# Patient Record
Sex: Female | Born: 1975 | Hispanic: Yes | Smoking: Never smoker
Health system: Southern US, Community
[De-identification: ages and names within clinical notes are randomized; demographics above are authoritative.]

## PROBLEM LIST (undated history)

## (undated) DIAGNOSIS — Z87442 Personal history of urinary calculi: Secondary | ICD-10-CM

## (undated) DIAGNOSIS — R110 Nausea: Secondary | ICD-10-CM

## (undated) DIAGNOSIS — Z889 Allergy status to unspecified drugs, medicaments and biological substances status: Secondary | ICD-10-CM

## (undated) DIAGNOSIS — F419 Anxiety disorder, unspecified: Secondary | ICD-10-CM

## (undated) DIAGNOSIS — M1990 Unspecified osteoarthritis, unspecified site: Secondary | ICD-10-CM

## (undated) DIAGNOSIS — F32A Depression, unspecified: Secondary | ICD-10-CM

## (undated) HISTORY — PX: APPENDECTOMY: SHX54

## (undated) HISTORY — PX: GANGLION CYST EXCISION: SHX1691

## (undated) HISTORY — PX: SEPTOPLASTY: SUR1290

## (undated) HISTORY — PX: WISDOM TOOTH EXTRACTION: SHX21

---

## 2001-05-26 ENCOUNTER — Encounter: Payer: Self-pay | Admitting: Internal Medicine

## 2001-05-26 ENCOUNTER — Ambulatory Visit (HOSPITAL_COMMUNITY): Admission: RE | Admit: 2001-05-26 | Discharge: 2001-05-26 | Payer: Self-pay | Admitting: Internal Medicine

## 2002-11-01 ENCOUNTER — Other Ambulatory Visit: Admission: RE | Admit: 2002-11-01 | Discharge: 2002-11-01 | Payer: Self-pay | Admitting: Obstetrics and Gynecology

## 2003-11-20 ENCOUNTER — Other Ambulatory Visit: Admission: RE | Admit: 2003-11-20 | Discharge: 2003-11-20 | Payer: Self-pay | Admitting: Obstetrics and Gynecology

## 2004-11-07 ENCOUNTER — Other Ambulatory Visit: Admission: RE | Admit: 2004-11-07 | Discharge: 2004-11-07 | Payer: Self-pay | Admitting: Obstetrics and Gynecology

## 2007-10-23 ENCOUNTER — Emergency Department (HOSPITAL_COMMUNITY): Admission: EM | Admit: 2007-10-23 | Discharge: 2007-10-23 | Payer: Self-pay | Admitting: Emergency Medicine

## 2008-01-31 ENCOUNTER — Encounter: Admission: RE | Admit: 2008-01-31 | Discharge: 2008-01-31 | Payer: Self-pay | Admitting: Internal Medicine

## 2010-01-28 ENCOUNTER — Encounter: Admission: RE | Admit: 2010-01-28 | Discharge: 2010-01-28 | Payer: Self-pay | Admitting: Internal Medicine

## 2010-06-20 ENCOUNTER — Encounter
Admission: RE | Admit: 2010-06-20 | Discharge: 2010-06-20 | Payer: Self-pay | Source: Home / Self Care | Attending: Gastroenterology | Admitting: Gastroenterology

## 2010-07-29 ENCOUNTER — Other Ambulatory Visit (HOSPITAL_COMMUNITY): Payer: Self-pay | Admitting: Gastroenterology

## 2010-07-29 DIAGNOSIS — R1013 Epigastric pain: Secondary | ICD-10-CM

## 2010-08-07 ENCOUNTER — Ambulatory Visit (HOSPITAL_COMMUNITY)
Admission: RE | Admit: 2010-08-07 | Discharge: 2010-08-07 | Disposition: A | Discharge status: Home / Self Care | Payer: BC Managed Care – PPO | Source: Ambulatory Visit | Attending: Gastroenterology | Admitting: Gastroenterology

## 2010-08-07 DIAGNOSIS — R1013 Epigastric pain: Secondary | ICD-10-CM | POA: Insufficient documentation

## 2010-08-07 MED ORDER — TECHNETIUM TC 99M MEBROFENIN IV KIT
5.0000 | PACK | Freq: Once | INTRAVENOUS | Status: AC | PRN
  Administered 2010-08-07: 5 via INTRAVENOUS

## 2010-08-19 ENCOUNTER — Other Ambulatory Visit (HOSPITAL_COMMUNITY): Payer: Self-pay | Admitting: Gastroenterology

## 2010-08-19 DIAGNOSIS — R1013 Epigastric pain: Secondary | ICD-10-CM

## 2010-08-19 DIAGNOSIS — R110 Nausea: Secondary | ICD-10-CM

## 2010-08-22 ENCOUNTER — Encounter (HOSPITAL_COMMUNITY)
Admission: RE | Admit: 2010-08-22 | Discharge: 2010-08-22 | Disposition: A | Discharge status: Home / Self Care | Payer: BC Managed Care – PPO | Source: Ambulatory Visit | Attending: Gastroenterology | Admitting: Gastroenterology

## 2010-08-22 ENCOUNTER — Encounter (HOSPITAL_COMMUNITY): Payer: Self-pay

## 2010-08-22 DIAGNOSIS — R110 Nausea: Secondary | ICD-10-CM

## 2010-08-22 DIAGNOSIS — E119 Type 2 diabetes mellitus without complications: Secondary | ICD-10-CM | POA: Insufficient documentation

## 2010-08-22 DIAGNOSIS — R1013 Epigastric pain: Secondary | ICD-10-CM | POA: Insufficient documentation

## 2010-08-22 HISTORY — DX: Nausea: R11.0

## 2010-08-22 MED ORDER — TECHNETIUM TC 99M SULFUR COLLOID
2.0000 | Freq: Once | INTRAVENOUS | Status: AC | PRN
  Administered 2010-08-22: 2 via ORAL

## 2011-03-05 LAB — PROTIME-INR
INR: 1.1
Prothrombin Time: 14.5

## 2011-03-05 LAB — POCT I-STAT, CHEM 8
BUN: 19
Calcium, Ion: 1.15
Chloride: 107
Creatinine, Ser: 0.9
Glucose, Bld: 80
HCT: 43
Hemoglobin: 14.6
Potassium: 3.8
Sodium: 139
TCO2: 22

## 2011-03-05 LAB — OCCULT BLOOD X 1 CARD TO LAB, STOOL: Fecal Occult Bld: POSITIVE

## 2011-09-07 ENCOUNTER — Emergency Department (HOSPITAL_COMMUNITY): Payer: BC Managed Care – PPO

## 2011-09-07 ENCOUNTER — Encounter (HOSPITAL_COMMUNITY): Payer: Self-pay | Admitting: *Deleted

## 2011-09-07 ENCOUNTER — Emergency Department (HOSPITAL_COMMUNITY)
Admission: EM | Admit: 2011-09-07 | Discharge: 2011-09-07 | Disposition: A | Discharge status: Home / Self Care | Payer: BC Managed Care – PPO | Attending: Emergency Medicine | Admitting: Emergency Medicine

## 2011-09-07 DIAGNOSIS — M549 Dorsalgia, unspecified: Secondary | ICD-10-CM | POA: Insufficient documentation

## 2011-09-07 DIAGNOSIS — N200 Calculus of kidney: Secondary | ICD-10-CM

## 2011-09-07 DIAGNOSIS — R300 Dysuria: Secondary | ICD-10-CM | POA: Insufficient documentation

## 2011-09-07 HISTORY — DX: Allergy status to unspecified drugs, medicaments and biological substances: Z88.9

## 2011-09-07 LAB — URINALYSIS, ROUTINE W REFLEX MICROSCOPIC
Nitrite: NEGATIVE
Protein, ur: NEGATIVE mg/dL
Specific Gravity, Urine: 1.027 (ref 1.005–1.030)
Urobilinogen, UA: 0.2 mg/dL (ref 0.0–1.0)

## 2011-09-07 LAB — POCT I-STAT, CHEM 8
Creatinine, Ser: 1 mg/dL (ref 0.50–1.10)
HCT: 38 % (ref 36.0–46.0)
Hemoglobin: 12.9 g/dL (ref 12.0–15.0)
Potassium: 4 mEq/L (ref 3.5–5.1)
Sodium: 141 mEq/L (ref 135–145)
TCO2: 20 mmol/L (ref 0–100)

## 2011-09-07 MED ORDER — HYDROCODONE-ACETAMINOPHEN 5-325 MG PO TABS
1.0000 | ORAL_TABLET | Freq: Once | ORAL | Status: AC
  Administered 2011-09-07: 1 via ORAL
  Filled 2011-09-07: qty 1

## 2011-09-07 MED ORDER — MORPHINE SULFATE 4 MG/ML IJ SOLN
4.0000 mg | Freq: Once | INTRAMUSCULAR | Status: AC
  Administered 2011-09-07: 4 mg via INTRAVENOUS
  Filled 2011-09-07: qty 1

## 2011-09-07 MED ORDER — KETOROLAC TROMETHAMINE 30 MG/ML IJ SOLN
30.0000 mg | Freq: Once | INTRAMUSCULAR | Status: AC
  Administered 2011-09-07: 30 mg via INTRAVENOUS
  Filled 2011-09-07: qty 1

## 2011-09-07 MED ORDER — SODIUM CHLORIDE 0.9 % IV BOLUS (SEPSIS)
1000.0000 mL | Freq: Once | INTRAVENOUS | Status: AC
  Administered 2011-09-07: 1000 mL via INTRAVENOUS

## 2011-09-07 MED ORDER — ONDANSETRON HCL 4 MG/2ML IJ SOLN
4.0000 mg | Freq: Once | INTRAMUSCULAR | Status: AC
  Administered 2011-09-07: 4 mg via INTRAVENOUS
  Filled 2011-09-07: qty 2

## 2011-09-07 MED ORDER — HYDROCODONE-ACETAMINOPHEN 5-325 MG PO TABS: 2.0000 | ORAL_TABLET | ORAL | Status: AC | PRN

## 2011-09-07 NOTE — Discharge Instructions (Signed)
You were seen and evaluated for your complaints of flank and back pains. Your CAT scan today shows signs of a possible kidney stone but at this time it appears the stone may have passed. Please followup with the urology specialist or your primary care provider for continued evaluation and treatment if you have continue to persist and pains. If you develop any fever, chills, sweats, persistent nausea vomiting return to the emergency room.   Kidney Stones Kidney stones (ureteral lithiasis) are deposits that form inside your kidneys. The intense pain is caused by the stone moving through the urinary tract. When the stone moves, the ureter goes into spasm around the stone. The stone is usually passed in the urine.  CAUSES   A disorder that makes certain neck glands produce too much parathyroid hormone (primary hyperparathyroidism).   A buildup of uric acid crystals.   Narrowing (stricture) of the ureter.   A kidney obstruction present at birth (congenital obstruction).   Previous surgery on the kidney or ureters.   Numerous kidney infections.  SYMPTOMS   Feeling sick to your stomach (nauseous).   Throwing up (vomiting).   Blood in the urine (hematuria).   Pain that usually spreads (radiates) to the groin.   Frequency or urgency of urination.  DIAGNOSIS   Taking a history and physical exam.   Blood or urine tests.   Computerized X-ray scan (CT scan).   Occasionally, an examination of the inside of the urinary bladder (cystoscopy) is performed.  TREATMENT   Observation.   Increasing your fluid intake.   Surgery may be needed if you have severe pain or persistent obstruction.  The size, location, and chemical composition are all important variables that will determine the proper choice of action for you. Talk to your caregiver to better understand your situation so that you will minimize the risk of injury to yourself and your kidney.  HOME CARE INSTRUCTIONS   Drink enough  water and fluids to keep your urine clear or pale yellow.   Strain all urine through the provided strainer. Keep all particulate matter and stones for your caregiver to see. The stone causing the pain may be as small as a grain of salt. It is very important to use the strainer each and every time you pass your urine. The collection of your stone will allow your caregiver to analyze it and verify that a stone has actually passed.   Only take over-the-counter or prescription medicines for pain, discomfort, or fever as directed by your caregiver.   Make a follow-up appointment with your caregiver as directed.   Get follow-up X-rays if required. The absence of pain does not always mean that the stone has passed. It may have only stopped moving. If the urine remains completely obstructed, it can cause loss of kidney function or even complete destruction of the kidney. It is your responsibility to make sure X-rays and follow-ups are completed. Ultrasounds of the kidney can show blockages and the status of the kidney. Ultrasounds are not associated with any radiation and can be performed easily in a matter of minutes.  SEEK IMMEDIATE MEDICAL CARE IF:   Pain cannot be controlled with the prescribed medicine.   You have a fever.   The severity or intensity of pain increases over 18 hours and is not relieved by pain medicine.   You develop a new onset of abdominal pain.   You feel faint or pass out.  MAKE SURE YOU:   Understand these instructions.  Will watch your condition.   Will get help right away if you are not doing well or get worse.  Document Released: 05/26/2005 Document Revised: 05/15/2011 Document Reviewed: 09/21/2009 [facility] Patient Information 2012 [place], [place].   RESOURCE GUIDE  Dental Problems  Patients with Medicaid: [facility] [phone] [address]                                           [phone] [address] Phone:   [phone]                                                  Phone:  [phone]  If unable to pay or uninsured, contact:  [facility] or [facility]. to become qualified for the adult dental clinic.  Chronic Pain Problems Contact [facility]  [phone] Patients need to be referred by their primary care doctor.  Insufficient Money for Medicine Contact [employer] Way:  call "211" or [facility] 201-062-8767.  No Primary Care Doctor Call [facility]  [phone] Other agencies that provide inexpensive medical care    [facility]  [phone]    [facility]  [phone]    [facility]  [phone]    [facility]  [phone]    [facility]  [phone]    [facility]  8182320498  Psychological Services [facility]  [phone] [facility]  [phone] [facility]   [phone] (emergency services 7697171739)  Substance Abuse Resources [facility]  623-167-5793 [facility] [phone] [facility] [phone] Floydene Flock ([phone] Residential & Outpatient Substance Abuse Program  9206935932  Abuse/Neglect [facility] Child Abuse Hotline (831)116-3558 [facility] Child Abuse Hotline (210)188-7220 (After Hours)  Emergency Shelter [facility] [phone]  [facility] [phone] [facility] [phone]  MRSA Hotline #:   872 618 2900    [facility] Resources  [facility]     [employer]                          [facility] Dept. [address] [place]                       [address]      [address]  [place]                                                Cristobal Goldmann Phone:  [phone]  Phone:  [phone]                  Phone:  [phone]  [facility] Phone:  [phone]  [facility] Child Abuse Hotline 620-376-3474 [phone] (After Hours)

## 2011-09-07 NOTE — ED Notes (Signed)
Pt c/o R low back pain radiating to R pelvis starting tonight at 2145, nausea, denies vomiting. Pt also c/o dysuria.

## 2011-09-07 NOTE — ED Provider Notes (Signed)
History     CSN: [id number]  Arrival date & time 09/07/11  0008   First MD Initiated Contact with Patient 09/07/11 0158      Chief Complaint  Patient presents with  . Back Pain    radiating to pelvis  . Dysuria    HPI  History provided by the patient. Patient is a 36 year old female with history of asthma who presents with complaints of right flank and abdomen pains began last evening around 9 PM. Patient reports being at church service at the time and fell Thursday bathroom but was only able to urinate small amount. Pains continue to increase quickly to a sharp steady pain. Pain is constant in any position or activity. Pain has started to be associated with some nausea patient denies any vomiting. Patient denies any similar symptoms previously. She reports feeling some hot and cold symptoms. Patient denies any associated fever, sweats, vomiting, diarrhea, constipation, vaginal discharge or vaginal bleeding. She denies any hematuria urinary frequency. Patient denies any aggravating or alleviating factors.    Past Medical History  Diagnosis Date  . Nausea   . Asthma   . H/O seasonal allergies     Past Surgical History  Procedure Date  . Appendectomy     No family history on file.  History  Substance Use Topics  . Smoking status: Not on file  . Smokeless tobacco: Not on file  . Alcohol Use: Yes    OB History    Grav Para Term Preterm Abortions TAB SAB Ect Mult Living                  Review of Systems  Constitutional: Positive for chills and appetite change. Negative for fever.  Respiratory: Negative for shortness of breath.   Cardiovascular: Negative for chest pain.  Gastrointestinal: Positive for nausea and abdominal pain. Negative for vomiting, diarrhea and constipation.  Genitourinary: Positive for dysuria and flank pain. Negative for frequency, hematuria, vaginal bleeding and vaginal discharge.  Skin: Negative for rash.    Allergies  Review of patient's  allergies indicates no known allergies.  Home Medications   Current Outpatient Rx  Name Route Sig Dispense Refill  . FEXOFENADINE-PSEUDOEPHED ER 180-240 MG PO TB24 Oral Take 1 tablet by mouth daily.    Marland Kitchen LEVOCETIRIZINE DIHYDROCHLORIDE 5 MG PO TABS Oral Take 5 mg by mouth every evening.      BP 139/95  Pulse 99  Temp(Src) 99 F (37.2 C) (Oral)  Resp 17  Ht 5\' 5"  (1.651 m)  Wt 162 lb 6.4 oz (73.664 kg)  BMI 27.02 kg/m2  SpO2 100%  LMP 08/28/2011  Physical Exam  Nursing note and vitals reviewed. Constitutional: She is oriented to person, place, and time. She appears well-developed and well-nourished. No distress.  HENT:  Head: Normocephalic and atraumatic.  Neck:       No meningeal signs  Cardiovascular: Normal rate and regular rhythm.   Pulmonary/Chest: Effort normal and breath sounds normal. No respiratory distress. She has no wheezes. She has no rales.  Abdominal: Soft. She exhibits no distension. There is no tenderness. There is no rebound and no guarding.       Right CVA tenderness  Musculoskeletal: She exhibits no edema and no tenderness.  Neurological: She is alert and oriented to person, place, and time.  Skin: Skin is warm and dry. No rash noted.  Psychiatric: She has a normal mood and affect. Her behavior is normal.    ED Course  Procedures  Results  for orders placed during the hospital encounter of 09/07/11  URINALYSIS, ROUTINE W REFLEX MICROSCOPIC      Component Value Range   Color, Urine YELLOW  YELLOW    APPearance CLOUDY (*) CLEAR    Specific Gravity, Urine 1.027  1.005 - 1.030    pH 5.0  5.0 - 8.0    Glucose, UA NEGATIVE  NEGATIVE (mg/dL)   Hgb urine dipstick NEGATIVE  NEGATIVE    Bilirubin Urine NEGATIVE  NEGATIVE    Ketones, ur NEGATIVE  NEGATIVE (mg/dL)   Protein, ur NEGATIVE  NEGATIVE (mg/dL)   Urobilinogen, UA 0.2  0.0 - 1.0 (mg/dL)   Nitrite NEGATIVE  NEGATIVE    Leukocytes, UA NEGATIVE  NEGATIVE   POCT I-STAT, CHEM 8      Component Value  Range   Sodium 141  135 - 145 (mEq/L)   Potassium 4.0  3.5 - 5.1 (mEq/L)   Chloride 110  96 - 112 (mEq/L)   BUN 18  6 - 23 (mg/dL)   Creatinine, Ser 5.39  0.50 - 1.10 (mg/dL)   Glucose, Bld 767 (*) 70 - 99 (mg/dL)   Calcium, Ion 3.41  9.37 - 1.32 (mmol/L)   TCO2 20  0 - 100 (mmol/L)   Hemoglobin 12.9  12.0 - 15.0 (g/dL)   HCT 90.2  40.9 - 73.5 (%)       Ct Abdomen Pelvis Wo Contrast  09/07/2011  *RADIOLOGY REPORT*  Clinical Data: Nausea.  Rule out kidney stone.  CT ABDOMEN AND PELVIS WITHOUT CONTRAST  Technique:  Multidetector CT imaging of the abdomen and pelvis was performed following the standard protocol without intravenous contrast.  Comparison: None.  Findings: The lung bases are clear.  There is right-sided pyelocaliectasis and ureterectasis to mild degree.  Mild periureteral stranding.  Tiny calcification measuring less than 3 mm located either in the right ureteral orifice or in the right side of the bladder.  The right ureter vesicle junction stone versus recently passed stone into the bladder is suggested. No other renal or ureteral stones are visualized.  No pyelocaliectasis or ureterectasis on the left side.  The unenhanced appearance of the liver, spleen, gallbladder, pancreas, adrenal glands, abdominal aorta, and retroperitoneal lymph nodes is unremarkable.  Stool filled colon without distension.  Stomach and small bowel are decompressed.  No free air or free fluid in the abdomen.  Pelvis:  The bladder is decompressed.  Uterus and adnexal structures are not significantly enlarged.  No free or loculated pelvic fluid collections.  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  Surgical absence of the appendix.  Normal alignment of the lumbar vertebrae.  IMPRESSION: Mild right pyelocaliectasis and ureterectasis.  Punctate stone demonstrated either in the right ureterovesicle junction or recently passed into the bladder.  Original Report Authenticated By: Marlon Pel, M.D.     1. Kidney stone       MDM  2:15 AM patient seen and evaluated. Patient in no acute distress. Patient does appear uncomfortable. Symptoms concerning for possible kidney stone. We'll evaluate with UA, noncontrast CT i-STAT. Pain medications ordered.        Angus Seller, [place] 09/07/11 [phone]

## 2011-09-08 NOTE — ED Provider Notes (Signed)
Medical screening examination/treatment/procedure(s) were performed by non-physician practitioner and as supervising physician I was immediately available for consultation/collaboration.   Andron Marrazzo, MD 09/08/11 0242 

## 2013-04-25 ENCOUNTER — Other Ambulatory Visit: Payer: Self-pay | Admitting: Internal Medicine

## 2013-04-25 ENCOUNTER — Ambulatory Visit
Admission: RE | Admit: 2013-04-25 | Discharge: 2013-04-25 | Disposition: A | Discharge status: Home / Self Care | Payer: BC Managed Care – PPO | Source: Ambulatory Visit | Attending: Internal Medicine | Admitting: Internal Medicine

## 2013-04-25 DIAGNOSIS — F29 Unspecified psychosis not due to a substance or known physiological condition: Secondary | ICD-10-CM

## 2013-04-26 ENCOUNTER — Other Ambulatory Visit: Payer: Self-pay | Admitting: Internal Medicine

## 2013-04-26 ENCOUNTER — Other Ambulatory Visit: Payer: BC Managed Care – PPO

## 2013-04-26 DIAGNOSIS — R55 Syncope and collapse: Secondary | ICD-10-CM

## 2013-04-27 ENCOUNTER — Ambulatory Visit
Admission: RE | Admit: 2013-04-27 | Discharge: 2013-04-27 | Disposition: A | Discharge status: Home / Self Care | Payer: BC Managed Care – PPO | Source: Ambulatory Visit | Attending: Internal Medicine | Admitting: Internal Medicine

## 2013-04-27 DIAGNOSIS — R55 Syncope and collapse: Secondary | ICD-10-CM

## 2017-04-14 DIAGNOSIS — R51 Headache: Secondary | ICD-10-CM | POA: Diagnosis not present

## 2017-04-14 DIAGNOSIS — M9903 Segmental and somatic dysfunction of lumbar region: Secondary | ICD-10-CM | POA: Diagnosis not present

## 2017-04-14 DIAGNOSIS — M9902 Segmental and somatic dysfunction of thoracic region: Secondary | ICD-10-CM | POA: Diagnosis not present

## 2017-04-14 DIAGNOSIS — M9901 Segmental and somatic dysfunction of cervical region: Secondary | ICD-10-CM | POA: Diagnosis not present

## 2017-05-12 DIAGNOSIS — M9903 Segmental and somatic dysfunction of lumbar region: Secondary | ICD-10-CM | POA: Diagnosis not present

## 2017-05-12 DIAGNOSIS — R51 Headache: Secondary | ICD-10-CM | POA: Diagnosis not present

## 2017-05-12 DIAGNOSIS — M9902 Segmental and somatic dysfunction of thoracic region: Secondary | ICD-10-CM | POA: Diagnosis not present

## 2017-05-12 DIAGNOSIS — M9901 Segmental and somatic dysfunction of cervical region: Secondary | ICD-10-CM | POA: Diagnosis not present

## 2017-05-25 DIAGNOSIS — J4530 Mild persistent asthma, uncomplicated: Secondary | ICD-10-CM | POA: Diagnosis not present

## 2017-05-25 DIAGNOSIS — J3089 Other allergic rhinitis: Secondary | ICD-10-CM | POA: Diagnosis not present

## 2017-05-25 DIAGNOSIS — H1045 Other chronic allergic conjunctivitis: Secondary | ICD-10-CM | POA: Diagnosis not present

## 2017-05-28 DIAGNOSIS — Z6826 Body mass index (BMI) 26.0-26.9, adult: Secondary | ICD-10-CM | POA: Diagnosis not present

## 2017-05-28 DIAGNOSIS — N926 Irregular menstruation, unspecified: Secondary | ICD-10-CM | POA: Diagnosis not present

## 2017-05-28 DIAGNOSIS — B373 Candidiasis of vulva and vagina: Secondary | ICD-10-CM | POA: Diagnosis not present

## 2017-05-28 DIAGNOSIS — Z3202 Encounter for pregnancy test, result negative: Secondary | ICD-10-CM | POA: Diagnosis not present

## 2017-06-16 DIAGNOSIS — M9902 Segmental and somatic dysfunction of thoracic region: Secondary | ICD-10-CM | POA: Diagnosis not present

## 2017-06-16 DIAGNOSIS — R51 Headache: Secondary | ICD-10-CM | POA: Diagnosis not present

## 2017-06-16 DIAGNOSIS — M9901 Segmental and somatic dysfunction of cervical region: Secondary | ICD-10-CM | POA: Diagnosis not present

## 2017-06-16 DIAGNOSIS — M9903 Segmental and somatic dysfunction of lumbar region: Secondary | ICD-10-CM | POA: Diagnosis not present

## 2017-06-22 DIAGNOSIS — Z0000 Encounter for general adult medical examination without abnormal findings: Secondary | ICD-10-CM | POA: Diagnosis not present

## 2017-06-22 DIAGNOSIS — N390 Urinary tract infection, site not specified: Secondary | ICD-10-CM | POA: Diagnosis not present

## 2017-06-22 DIAGNOSIS — G44209 Tension-type headache, unspecified, not intractable: Secondary | ICD-10-CM | POA: Diagnosis not present

## 2017-06-29 DIAGNOSIS — J4520 Mild intermittent asthma, uncomplicated: Secondary | ICD-10-CM | POA: Diagnosis not present

## 2017-06-29 DIAGNOSIS — J301 Allergic rhinitis due to pollen: Secondary | ICD-10-CM | POA: Diagnosis not present

## 2017-06-29 DIAGNOSIS — Z0000 Encounter for general adult medical examination without abnormal findings: Secondary | ICD-10-CM | POA: Diagnosis not present

## 2017-06-30 DIAGNOSIS — M9901 Segmental and somatic dysfunction of cervical region: Secondary | ICD-10-CM | POA: Diagnosis not present

## 2017-06-30 DIAGNOSIS — R51 Headache: Secondary | ICD-10-CM | POA: Diagnosis not present

## 2017-06-30 DIAGNOSIS — M9903 Segmental and somatic dysfunction of lumbar region: Secondary | ICD-10-CM | POA: Diagnosis not present

## 2017-06-30 DIAGNOSIS — M9902 Segmental and somatic dysfunction of thoracic region: Secondary | ICD-10-CM | POA: Diagnosis not present

## 2017-07-01 DIAGNOSIS — M9901 Segmental and somatic dysfunction of cervical region: Secondary | ICD-10-CM | POA: Diagnosis not present

## 2017-07-01 DIAGNOSIS — R51 Headache: Secondary | ICD-10-CM | POA: Diagnosis not present

## 2017-07-01 DIAGNOSIS — M9903 Segmental and somatic dysfunction of lumbar region: Secondary | ICD-10-CM | POA: Diagnosis not present

## 2017-07-01 DIAGNOSIS — M9902 Segmental and somatic dysfunction of thoracic region: Secondary | ICD-10-CM | POA: Diagnosis not present

## 2017-07-03 DIAGNOSIS — M9902 Segmental and somatic dysfunction of thoracic region: Secondary | ICD-10-CM | POA: Diagnosis not present

## 2017-07-03 DIAGNOSIS — M9903 Segmental and somatic dysfunction of lumbar region: Secondary | ICD-10-CM | POA: Diagnosis not present

## 2017-07-03 DIAGNOSIS — M9901 Segmental and somatic dysfunction of cervical region: Secondary | ICD-10-CM | POA: Diagnosis not present

## 2017-07-03 DIAGNOSIS — R51 Headache: Secondary | ICD-10-CM | POA: Diagnosis not present

## 2017-07-06 DIAGNOSIS — M9902 Segmental and somatic dysfunction of thoracic region: Secondary | ICD-10-CM | POA: Diagnosis not present

## 2017-07-06 DIAGNOSIS — M9901 Segmental and somatic dysfunction of cervical region: Secondary | ICD-10-CM | POA: Diagnosis not present

## 2017-07-06 DIAGNOSIS — R51 Headache: Secondary | ICD-10-CM | POA: Diagnosis not present

## 2017-07-06 DIAGNOSIS — M9903 Segmental and somatic dysfunction of lumbar region: Secondary | ICD-10-CM | POA: Diagnosis not present

## 2017-07-08 DIAGNOSIS — M9902 Segmental and somatic dysfunction of thoracic region: Secondary | ICD-10-CM | POA: Diagnosis not present

## 2017-07-08 DIAGNOSIS — R51 Headache: Secondary | ICD-10-CM | POA: Diagnosis not present

## 2017-07-08 DIAGNOSIS — M9901 Segmental and somatic dysfunction of cervical region: Secondary | ICD-10-CM | POA: Diagnosis not present

## 2017-07-08 DIAGNOSIS — M9903 Segmental and somatic dysfunction of lumbar region: Secondary | ICD-10-CM | POA: Diagnosis not present

## 2017-07-09 DIAGNOSIS — B373 Candidiasis of vulva and vagina: Secondary | ICD-10-CM | POA: Diagnosis not present

## 2017-07-09 DIAGNOSIS — Z6826 Body mass index (BMI) 26.0-26.9, adult: Secondary | ICD-10-CM | POA: Diagnosis not present

## 2017-07-10 DIAGNOSIS — M9903 Segmental and somatic dysfunction of lumbar region: Secondary | ICD-10-CM | POA: Diagnosis not present

## 2017-07-10 DIAGNOSIS — R51 Headache: Secondary | ICD-10-CM | POA: Diagnosis not present

## 2017-07-10 DIAGNOSIS — M9902 Segmental and somatic dysfunction of thoracic region: Secondary | ICD-10-CM | POA: Diagnosis not present

## 2017-07-10 DIAGNOSIS — M9901 Segmental and somatic dysfunction of cervical region: Secondary | ICD-10-CM | POA: Diagnosis not present

## 2017-07-14 DIAGNOSIS — M9903 Segmental and somatic dysfunction of lumbar region: Secondary | ICD-10-CM | POA: Diagnosis not present

## 2017-07-14 DIAGNOSIS — M9902 Segmental and somatic dysfunction of thoracic region: Secondary | ICD-10-CM | POA: Diagnosis not present

## 2017-07-14 DIAGNOSIS — R51 Headache: Secondary | ICD-10-CM | POA: Diagnosis not present

## 2017-07-14 DIAGNOSIS — M9901 Segmental and somatic dysfunction of cervical region: Secondary | ICD-10-CM | POA: Diagnosis not present

## 2017-07-17 DIAGNOSIS — M9903 Segmental and somatic dysfunction of lumbar region: Secondary | ICD-10-CM | POA: Diagnosis not present

## 2017-07-17 DIAGNOSIS — H8110 Benign paroxysmal vertigo, unspecified ear: Secondary | ICD-10-CM | POA: Diagnosis not present

## 2017-07-17 DIAGNOSIS — M9901 Segmental and somatic dysfunction of cervical region: Secondary | ICD-10-CM | POA: Diagnosis not present

## 2017-07-17 DIAGNOSIS — R51 Headache: Secondary | ICD-10-CM | POA: Diagnosis not present

## 2017-07-17 DIAGNOSIS — J069 Acute upper respiratory infection, unspecified: Secondary | ICD-10-CM | POA: Diagnosis not present

## 2017-07-17 DIAGNOSIS — M9902 Segmental and somatic dysfunction of thoracic region: Secondary | ICD-10-CM | POA: Diagnosis not present

## 2017-07-17 DIAGNOSIS — H6982 Other specified disorders of Eustachian tube, left ear: Secondary | ICD-10-CM | POA: Diagnosis not present

## 2017-07-24 DIAGNOSIS — R51 Headache: Secondary | ICD-10-CM | POA: Diagnosis not present

## 2017-07-24 DIAGNOSIS — M9903 Segmental and somatic dysfunction of lumbar region: Secondary | ICD-10-CM | POA: Diagnosis not present

## 2017-07-24 DIAGNOSIS — M9901 Segmental and somatic dysfunction of cervical region: Secondary | ICD-10-CM | POA: Diagnosis not present

## 2017-07-24 DIAGNOSIS — M9902 Segmental and somatic dysfunction of thoracic region: Secondary | ICD-10-CM | POA: Diagnosis not present

## 2017-07-30 DIAGNOSIS — R87619 Unspecified abnormal cytological findings in specimens from cervix uteri: Secondary | ICD-10-CM | POA: Diagnosis not present

## 2017-07-30 DIAGNOSIS — Z6826 Body mass index (BMI) 26.0-26.9, adult: Secondary | ICD-10-CM | POA: Diagnosis not present

## 2017-08-13 DIAGNOSIS — R87619 Unspecified abnormal cytological findings in specimens from cervix uteri: Secondary | ICD-10-CM | POA: Diagnosis not present

## 2017-08-13 DIAGNOSIS — Z6826 Body mass index (BMI) 26.0-26.9, adult: Secondary | ICD-10-CM | POA: Diagnosis not present

## 2017-08-31 DIAGNOSIS — Z6826 Body mass index (BMI) 26.0-26.9, adult: Secondary | ICD-10-CM | POA: Diagnosis not present

## 2017-08-31 DIAGNOSIS — Z113 Encounter for screening for infections with a predominantly sexual mode of transmission: Secondary | ICD-10-CM | POA: Diagnosis not present

## 2017-08-31 DIAGNOSIS — B373 Candidiasis of vulva and vagina: Secondary | ICD-10-CM | POA: Diagnosis not present

## 2017-09-11 DIAGNOSIS — J301 Allergic rhinitis due to pollen: Secondary | ICD-10-CM | POA: Diagnosis not present

## 2017-10-22 DIAGNOSIS — J3081 Allergic rhinitis due to animal (cat) (dog) hair and dander: Secondary | ICD-10-CM | POA: Diagnosis not present

## 2017-10-22 DIAGNOSIS — J3089 Other allergic rhinitis: Secondary | ICD-10-CM | POA: Diagnosis not present

## 2018-01-06 DIAGNOSIS — J301 Allergic rhinitis due to pollen: Secondary | ICD-10-CM | POA: Diagnosis not present

## 2018-01-06 DIAGNOSIS — S99912A Unspecified injury of left ankle, initial encounter: Secondary | ICD-10-CM | POA: Diagnosis not present

## 2018-01-06 DIAGNOSIS — M25572 Pain in left ankle and joints of left foot: Secondary | ICD-10-CM | POA: Diagnosis not present

## 2018-01-20 DIAGNOSIS — J301 Allergic rhinitis due to pollen: Secondary | ICD-10-CM | POA: Diagnosis not present

## 2018-02-05 DIAGNOSIS — N760 Acute vaginitis: Secondary | ICD-10-CM | POA: Diagnosis not present

## 2018-02-05 DIAGNOSIS — Z6827 Body mass index (BMI) 27.0-27.9, adult: Secondary | ICD-10-CM | POA: Diagnosis not present

## 2018-03-22 DIAGNOSIS — H40023 Open angle with borderline findings, high risk, bilateral: Secondary | ICD-10-CM | POA: Diagnosis not present

## 2018-04-01 DIAGNOSIS — Z6828 Body mass index (BMI) 28.0-28.9, adult: Secondary | ICD-10-CM | POA: Diagnosis not present

## 2018-04-01 DIAGNOSIS — Z124 Encounter for screening for malignant neoplasm of cervix: Secondary | ICD-10-CM | POA: Diagnosis not present

## 2018-04-01 DIAGNOSIS — Z01419 Encounter for gynecological examination (general) (routine) without abnormal findings: Secondary | ICD-10-CM | POA: Diagnosis not present

## 2018-04-20 DIAGNOSIS — J3089 Other allergic rhinitis: Secondary | ICD-10-CM | POA: Diagnosis not present

## 2018-04-20 DIAGNOSIS — J3081 Allergic rhinitis due to animal (cat) (dog) hair and dander: Secondary | ICD-10-CM | POA: Diagnosis not present

## 2018-04-26 DIAGNOSIS — R000 Tachycardia, unspecified: Secondary | ICD-10-CM | POA: Diagnosis not present

## 2018-04-26 DIAGNOSIS — F419 Anxiety disorder, unspecified: Secondary | ICD-10-CM | POA: Diagnosis not present

## 2018-04-26 DIAGNOSIS — F410 Panic disorder [episodic paroxysmal anxiety] without agoraphobia: Secondary | ICD-10-CM | POA: Diagnosis not present

## 2018-04-26 DIAGNOSIS — F430 Acute stress reaction: Secondary | ICD-10-CM | POA: Diagnosis not present

## 2018-05-05 DIAGNOSIS — F430 Acute stress reaction: Secondary | ICD-10-CM | POA: Diagnosis not present

## 2018-05-05 DIAGNOSIS — R000 Tachycardia, unspecified: Secondary | ICD-10-CM | POA: Diagnosis not present

## 2018-05-05 DIAGNOSIS — F419 Anxiety disorder, unspecified: Secondary | ICD-10-CM | POA: Diagnosis not present

## 2018-05-05 DIAGNOSIS — F410 Panic disorder [episodic paroxysmal anxiety] without agoraphobia: Secondary | ICD-10-CM | POA: Diagnosis not present

## 2018-05-24 DIAGNOSIS — J4530 Mild persistent asthma, uncomplicated: Secondary | ICD-10-CM | POA: Diagnosis not present

## 2018-05-24 DIAGNOSIS — J3089 Other allergic rhinitis: Secondary | ICD-10-CM | POA: Diagnosis not present

## 2018-05-24 DIAGNOSIS — H1045 Other chronic allergic conjunctivitis: Secondary | ICD-10-CM | POA: Diagnosis not present

## 2018-06-11 DIAGNOSIS — R51 Headache: Secondary | ICD-10-CM | POA: Diagnosis not present

## 2018-06-11 DIAGNOSIS — M9901 Segmental and somatic dysfunction of cervical region: Secondary | ICD-10-CM | POA: Diagnosis not present

## 2018-06-11 DIAGNOSIS — M9903 Segmental and somatic dysfunction of lumbar region: Secondary | ICD-10-CM | POA: Diagnosis not present

## 2018-06-11 DIAGNOSIS — M9902 Segmental and somatic dysfunction of thoracic region: Secondary | ICD-10-CM | POA: Diagnosis not present

## 2018-07-08 DIAGNOSIS — J301 Allergic rhinitis due to pollen: Secondary | ICD-10-CM | POA: Diagnosis not present

## 2018-07-12 DIAGNOSIS — Z0000 Encounter for general adult medical examination without abnormal findings: Secondary | ICD-10-CM | POA: Diagnosis not present

## 2018-07-16 DIAGNOSIS — M9901 Segmental and somatic dysfunction of cervical region: Secondary | ICD-10-CM | POA: Diagnosis not present

## 2018-07-16 DIAGNOSIS — R51 Headache: Secondary | ICD-10-CM | POA: Diagnosis not present

## 2018-07-16 DIAGNOSIS — M9902 Segmental and somatic dysfunction of thoracic region: Secondary | ICD-10-CM | POA: Diagnosis not present

## 2018-07-16 DIAGNOSIS — M9903 Segmental and somatic dysfunction of lumbar region: Secondary | ICD-10-CM | POA: Diagnosis not present

## 2018-07-19 DIAGNOSIS — Z0000 Encounter for general adult medical examination without abnormal findings: Secondary | ICD-10-CM | POA: Diagnosis not present

## 2018-07-19 DIAGNOSIS — J4520 Mild intermittent asthma, uncomplicated: Secondary | ICD-10-CM | POA: Diagnosis not present

## 2018-07-19 DIAGNOSIS — F321 Major depressive disorder, single episode, moderate: Secondary | ICD-10-CM | POA: Diagnosis not present

## 2018-07-19 DIAGNOSIS — R000 Tachycardia, unspecified: Secondary | ICD-10-CM | POA: Diagnosis not present

## 2018-08-13 DIAGNOSIS — M9901 Segmental and somatic dysfunction of cervical region: Secondary | ICD-10-CM | POA: Diagnosis not present

## 2018-08-13 DIAGNOSIS — M9902 Segmental and somatic dysfunction of thoracic region: Secondary | ICD-10-CM | POA: Diagnosis not present

## 2018-08-13 DIAGNOSIS — M9903 Segmental and somatic dysfunction of lumbar region: Secondary | ICD-10-CM | POA: Diagnosis not present

## 2018-08-13 DIAGNOSIS — R51 Headache: Secondary | ICD-10-CM | POA: Diagnosis not present

## 2018-08-16 DIAGNOSIS — M545 Low back pain: Secondary | ICD-10-CM | POA: Diagnosis not present

## 2018-08-16 DIAGNOSIS — E663 Overweight: Secondary | ICD-10-CM | POA: Diagnosis not present

## 2018-08-20 DIAGNOSIS — M9901 Segmental and somatic dysfunction of cervical region: Secondary | ICD-10-CM | POA: Diagnosis not present

## 2018-08-20 DIAGNOSIS — M9903 Segmental and somatic dysfunction of lumbar region: Secondary | ICD-10-CM | POA: Diagnosis not present

## 2018-08-20 DIAGNOSIS — R51 Headache: Secondary | ICD-10-CM | POA: Diagnosis not present

## 2018-08-20 DIAGNOSIS — M9902 Segmental and somatic dysfunction of thoracic region: Secondary | ICD-10-CM | POA: Diagnosis not present

## 2018-08-25 DIAGNOSIS — M9902 Segmental and somatic dysfunction of thoracic region: Secondary | ICD-10-CM | POA: Diagnosis not present

## 2018-08-25 DIAGNOSIS — M9901 Segmental and somatic dysfunction of cervical region: Secondary | ICD-10-CM | POA: Diagnosis not present

## 2018-08-25 DIAGNOSIS — R51 Headache: Secondary | ICD-10-CM | POA: Diagnosis not present

## 2018-08-25 DIAGNOSIS — M9903 Segmental and somatic dysfunction of lumbar region: Secondary | ICD-10-CM | POA: Diagnosis not present

## 2018-08-27 DIAGNOSIS — M9902 Segmental and somatic dysfunction of thoracic region: Secondary | ICD-10-CM | POA: Diagnosis not present

## 2018-08-27 DIAGNOSIS — R51 Headache: Secondary | ICD-10-CM | POA: Diagnosis not present

## 2018-08-27 DIAGNOSIS — M9901 Segmental and somatic dysfunction of cervical region: Secondary | ICD-10-CM | POA: Diagnosis not present

## 2018-08-27 DIAGNOSIS — M9903 Segmental and somatic dysfunction of lumbar region: Secondary | ICD-10-CM | POA: Diagnosis not present

## 2018-08-31 DIAGNOSIS — M9901 Segmental and somatic dysfunction of cervical region: Secondary | ICD-10-CM | POA: Diagnosis not present

## 2018-08-31 DIAGNOSIS — M9902 Segmental and somatic dysfunction of thoracic region: Secondary | ICD-10-CM | POA: Diagnosis not present

## 2018-08-31 DIAGNOSIS — M9903 Segmental and somatic dysfunction of lumbar region: Secondary | ICD-10-CM | POA: Diagnosis not present

## 2018-08-31 DIAGNOSIS — R51 Headache: Secondary | ICD-10-CM | POA: Diagnosis not present

## 2018-09-06 DIAGNOSIS — M9902 Segmental and somatic dysfunction of thoracic region: Secondary | ICD-10-CM | POA: Diagnosis not present

## 2018-09-06 DIAGNOSIS — M9901 Segmental and somatic dysfunction of cervical region: Secondary | ICD-10-CM | POA: Diagnosis not present

## 2018-09-06 DIAGNOSIS — M9903 Segmental and somatic dysfunction of lumbar region: Secondary | ICD-10-CM | POA: Diagnosis not present

## 2018-09-06 DIAGNOSIS — R51 Headache: Secondary | ICD-10-CM | POA: Diagnosis not present

## 2018-09-14 DIAGNOSIS — E663 Overweight: Secondary | ICD-10-CM | POA: Diagnosis not present

## 2018-09-14 DIAGNOSIS — J301 Allergic rhinitis due to pollen: Secondary | ICD-10-CM | POA: Diagnosis not present

## 2018-09-14 DIAGNOSIS — Z713 Dietary counseling and surveillance: Secondary | ICD-10-CM | POA: Diagnosis not present

## 2018-09-17 DIAGNOSIS — M9903 Segmental and somatic dysfunction of lumbar region: Secondary | ICD-10-CM | POA: Diagnosis not present

## 2018-09-17 DIAGNOSIS — R51 Headache: Secondary | ICD-10-CM | POA: Diagnosis not present

## 2018-09-17 DIAGNOSIS — M9901 Segmental and somatic dysfunction of cervical region: Secondary | ICD-10-CM | POA: Diagnosis not present

## 2018-09-17 DIAGNOSIS — M9902 Segmental and somatic dysfunction of thoracic region: Secondary | ICD-10-CM | POA: Diagnosis not present

## 2018-10-01 DIAGNOSIS — M9902 Segmental and somatic dysfunction of thoracic region: Secondary | ICD-10-CM | POA: Diagnosis not present

## 2018-10-01 DIAGNOSIS — M9903 Segmental and somatic dysfunction of lumbar region: Secondary | ICD-10-CM | POA: Diagnosis not present

## 2018-10-01 DIAGNOSIS — R51 Headache: Secondary | ICD-10-CM | POA: Diagnosis not present

## 2018-10-01 DIAGNOSIS — M9901 Segmental and somatic dysfunction of cervical region: Secondary | ICD-10-CM | POA: Diagnosis not present

## 2018-10-12 DIAGNOSIS — J3089 Other allergic rhinitis: Secondary | ICD-10-CM | POA: Diagnosis not present

## 2018-10-12 DIAGNOSIS — J3081 Allergic rhinitis due to animal (cat) (dog) hair and dander: Secondary | ICD-10-CM | POA: Diagnosis not present

## 2018-11-03 DIAGNOSIS — J3089 Other allergic rhinitis: Secondary | ICD-10-CM | POA: Diagnosis not present

## 2018-11-03 DIAGNOSIS — J3081 Allergic rhinitis due to animal (cat) (dog) hair and dander: Secondary | ICD-10-CM | POA: Diagnosis not present

## 2018-11-03 DIAGNOSIS — J301 Allergic rhinitis due to pollen: Secondary | ICD-10-CM | POA: Diagnosis not present

## 2018-11-04 DIAGNOSIS — R935 Abnormal findings on diagnostic imaging of other abdominal regions, including retroperitoneum: Secondary | ICD-10-CM | POA: Diagnosis not present

## 2018-11-04 DIAGNOSIS — M6140 Other calcification of muscle, unspecified site: Secondary | ICD-10-CM | POA: Diagnosis not present

## 2018-11-10 DIAGNOSIS — J301 Allergic rhinitis due to pollen: Secondary | ICD-10-CM | POA: Diagnosis not present

## 2018-11-10 DIAGNOSIS — J3089 Other allergic rhinitis: Secondary | ICD-10-CM | POA: Diagnosis not present

## 2018-11-10 DIAGNOSIS — J3081 Allergic rhinitis due to animal (cat) (dog) hair and dander: Secondary | ICD-10-CM | POA: Diagnosis not present

## 2018-11-17 DIAGNOSIS — J301 Allergic rhinitis due to pollen: Secondary | ICD-10-CM | POA: Diagnosis not present

## 2018-11-17 DIAGNOSIS — J3081 Allergic rhinitis due to animal (cat) (dog) hair and dander: Secondary | ICD-10-CM | POA: Diagnosis not present

## 2018-11-17 DIAGNOSIS — J3089 Other allergic rhinitis: Secondary | ICD-10-CM | POA: Diagnosis not present

## 2018-11-24 DIAGNOSIS — J3089 Other allergic rhinitis: Secondary | ICD-10-CM | POA: Diagnosis not present

## 2018-11-24 DIAGNOSIS — J3081 Allergic rhinitis due to animal (cat) (dog) hair and dander: Secondary | ICD-10-CM | POA: Diagnosis not present

## 2018-11-24 DIAGNOSIS — J301 Allergic rhinitis due to pollen: Secondary | ICD-10-CM | POA: Diagnosis not present

## 2018-12-01 DIAGNOSIS — J3081 Allergic rhinitis due to animal (cat) (dog) hair and dander: Secondary | ICD-10-CM | POA: Diagnosis not present

## 2018-12-01 DIAGNOSIS — J3089 Other allergic rhinitis: Secondary | ICD-10-CM | POA: Diagnosis not present

## 2018-12-01 DIAGNOSIS — J301 Allergic rhinitis due to pollen: Secondary | ICD-10-CM | POA: Diagnosis not present

## 2018-12-08 ENCOUNTER — Emergency Department (HOSPITAL_COMMUNITY)
Admission: EM | Admit: 2018-12-08 | Discharge: 2018-12-08 | Disposition: A | Discharge status: Home / Self Care | Payer: BC Managed Care – PPO | Attending: Emergency Medicine | Admitting: Emergency Medicine

## 2018-12-08 ENCOUNTER — Encounter (HOSPITAL_COMMUNITY): Payer: Self-pay | Admitting: Emergency Medicine

## 2018-12-08 ENCOUNTER — Other Ambulatory Visit: Payer: Self-pay

## 2018-12-08 DIAGNOSIS — Y939 Activity, unspecified: Secondary | ICD-10-CM | POA: Insufficient documentation

## 2018-12-08 DIAGNOSIS — S6992XA Unspecified injury of left wrist, hand and finger(s), initial encounter: Secondary | ICD-10-CM | POA: Diagnosis not present

## 2018-12-08 DIAGNOSIS — J3081 Allergic rhinitis due to animal (cat) (dog) hair and dander: Secondary | ICD-10-CM | POA: Diagnosis not present

## 2018-12-08 DIAGNOSIS — Z23 Encounter for immunization: Secondary | ICD-10-CM | POA: Insufficient documentation

## 2018-12-08 DIAGNOSIS — J301 Allergic rhinitis due to pollen: Secondary | ICD-10-CM | POA: Diagnosis not present

## 2018-12-08 DIAGNOSIS — J3089 Other allergic rhinitis: Secondary | ICD-10-CM | POA: Diagnosis not present

## 2018-12-08 DIAGNOSIS — Z79899 Other long term (current) drug therapy: Secondary | ICD-10-CM | POA: Insufficient documentation

## 2018-12-08 DIAGNOSIS — W5501XA Bitten by cat, initial encounter: Secondary | ICD-10-CM | POA: Diagnosis not present

## 2018-12-08 DIAGNOSIS — J45909 Unspecified asthma, uncomplicated: Secondary | ICD-10-CM | POA: Diagnosis not present

## 2018-12-08 DIAGNOSIS — S61452A Open bite of left hand, initial encounter: Secondary | ICD-10-CM | POA: Diagnosis not present

## 2018-12-08 DIAGNOSIS — S61432A Puncture wound without foreign body of left hand, initial encounter: Secondary | ICD-10-CM | POA: Diagnosis not present

## 2018-12-08 DIAGNOSIS — Y999 Unspecified external cause status: Secondary | ICD-10-CM | POA: Insufficient documentation

## 2018-12-08 DIAGNOSIS — Y929 Unspecified place or not applicable: Secondary | ICD-10-CM | POA: Insufficient documentation

## 2018-12-08 MED ORDER — BACITRACIN ZINC 500 UNIT/GM EX OINT
TOPICAL_OINTMENT | Freq: Once | CUTANEOUS | Status: AC
  Administered 2018-12-08: 1 via TOPICAL
  Filled 2018-12-08: qty 1.8

## 2018-12-08 MED ORDER — LIDOCAINE HCL (PF) 1 % IJ SOLN
30.0000 mL | Freq: Once | INTRAMUSCULAR | Status: AC
  Administered 2018-12-08: 30 mL
  Filled 2018-12-08: qty 30

## 2018-12-08 MED ORDER — AMOXICILLIN-POT CLAVULANATE 875-125 MG PO TABS
1.0000 | ORAL_TABLET | Freq: Once | ORAL | Status: AC
  Administered 2018-12-08: 1 via ORAL
  Filled 2018-12-08: qty 1

## 2018-12-08 MED ORDER — TETANUS-DIPHTH-ACELL PERTUSSIS 5-2.5-18.5 LF-MCG/0.5 IM SUSP
0.5000 mL | Freq: Once | INTRAMUSCULAR | Status: AC
  Administered 2018-12-08: 0.5 mL via INTRAMUSCULAR
  Filled 2018-12-08: qty 0.5

## 2018-12-08 MED ORDER — AMOXICILLIN-POT CLAVULANATE 875-125 MG PO TABS: 1.0000 | ORAL_TABLET | Freq: Two times a day (BID) | ORAL | 0 refills | Status: DC

## 2018-12-08 NOTE — Discharge Instructions (Addendum)
Cat bites are high risk for developing infection. Your wounds were irrigated copiously in the ER.  Take antibiotics as directed.  If you notice swelling, redness, or increased pain return to the ER.  Otherwise, follow-up with an orthopedic hand specialist.  Please notify animal control about the cat bite.  They will need to monitor the cat since it hasn't had it's rabies shots.  If it becomes symptomatic, you will need to return for rabies immunization.

## 2018-12-08 NOTE — ED Notes (Signed)
Bed: WTR7 Expected date:  Expected time:  Means of arrival:  Comments: 

## 2018-12-08 NOTE — ED Triage Notes (Signed)
Patient here from home reporting she was bit and scratched by friends cat. Cat is not up to date on shots. Bleeding controlled. Bites and scratches to left hand and bilateral feet.

## 2018-12-08 NOTE — ED Provider Notes (Signed)
[facility] DEPT Provider Note   CSN: [id number] Arrival date & time: 12/08/18  2104     History   Chief Complaint Chief Complaint  Patient presents with  . Animal Bite  . Rabies Injection    HPI Rebecca Mcdaniel is a 43 y.o. female.     Patient presents to the emergency department with a chief complaint of cat bite.  She states that she was bitten by her friends Psychologist, occupational.  The cat has not had shots, but is a pet.  She was bitten on the left hand, and sustained puncture wounds on the back of the hand.  She denies any swelling.  Reports mild pain.  Denies any fevers chills.  Denies any treatments prior to arrival.  Last tetanus shot unknown.  The history is provided by the patient. No language interpreter was used.    Past Medical History:  Diagnosis Date  . Asthma   . H/O seasonal allergies   . Nausea     There are no active problems to display for this patient.   Past Surgical History:  Procedure Laterality Date  . APPENDECTOMY       OB History   No obstetric history on file.      Home Medications    Prior to Admission medications   Medication Sig Start Date End Date Taking? Authorizing Provider  fexofenadine-pseudoephedrine (ALLEGRA-D 24) 180-240 MG per 24 hr tablet Take 1 tablet by mouth daily.    [provider]  levocetirizine (XYZAL) 5 MG tablet Take 5 mg by mouth every evening.    [provider]    Family History No family history on file.  Social History Social History   Tobacco Use  . Smoking status: Never Smoker  . Smokeless tobacco: Never Used  Substance Use Topics  . Alcohol use: Yes  . Drug use: No     Allergies   Patient has no known allergies.   Review of Systems Review of Systems  All other systems reviewed and are negative.    Physical Exam Updated Vital Signs BP (!) 130/94 (BP Location: Right Arm)   Pulse 98   Temp 98.8 F (37.1 C) (Oral)   Resp 19   SpO2 100%    Physical Exam Vitals signs and nursing note reviewed.  Constitutional:      General: She is not in acute distress.    Appearance: She is well-developed.  HENT:     Head: Normocephalic and atraumatic.  Eyes:     Conjunctiva/sclera: Conjunctivae normal.  Neck:     Musculoskeletal: Neck supple.  Cardiovascular:     Rate and Rhythm: Normal rate and regular rhythm.     Heart sounds: No murmur.  Pulmonary:     Effort: Pulmonary effort is normal. No respiratory distress.     Breath sounds: Normal breath sounds.  Abdominal:     Palpations: Abdomen is soft.     Tenderness: There is no abdominal tenderness.  Musculoskeletal:     Comments: 2 puncture wounds to the posterior aspect of the left hand near the second MCP, no visible puncture wounds on the palmar surface  Skin:    General: Skin is warm and dry.  Neurological:     Mental Status: She is alert and oriented to person, place, and time.  Psychiatric:        Mood and Affect: Mood normal.        Behavior: Behavior normal.  Thought Content: Thought content normal.        Judgment: Judgment normal.      ED Treatments / Results  Labs (all labs ordered are listed, but only abnormal results are displayed) Labs Reviewed - No data to display  EKG None  Radiology No results found.  Procedures Procedures (including critical care time) INCISION AND DRAINAGE Performed by: Montine Circle Consent: Verbal consent obtained. Risks and benefits: risks, benefits and alternatives were discussed Type: abscess  Body area: Posterior left hand  Anesthesia: local infiltration  Incision was made with a scalpel.  Local anesthetic: lidocaine 1 % without epinephrine  Anesthetic total: 3 ml  Complexity: simple Drainage: none  Drainage amount: none  Packing material: not packed  Patient tolerance: Patient had brief syncopal episode lasting about 10s during the procedure.  She was reclined and did not sustain any injury.        Medications Ordered in ED Medications  lidocaine (PF) (XYLOCAINE) 1 % injection 30 mL (has no administration in time range)  Tdap (BOOSTRIX) injection 0.5 mL (has no administration in time range)     Initial Impression / Assessment and Plan / ED Course  I have reviewed the triage vital signs and the nursing notes.  Pertinent labs & imaging results that were available during my care of the patient were reviewed by me and considered in my medical decision making (see chart for details).       Patient here with cat bite to her left hand.  She has several puncture wounds on the posterior aspect of the left hand near the second MCP.  These wounds were opened a bit further in the ED, copiously irrigated, and dressed.  Patient given Augmentin tetanus shot.    Discussed that the cat bite is high risk for developing infection.  Recommend she follow-up with her care or hand specialist or here if symptoms are worsening.    Final Clinical Impressions(s) / ED Diagnoses   Final diagnoses:  Cat bite, initial encounter    ED Discharge Orders    None       Montine Circle, PA-C 12/08/18 2336    Sherwood Gambler, MD 12/09/18 [phone]

## 2018-12-14 DIAGNOSIS — S51852A Open bite of left forearm, initial encounter: Secondary | ICD-10-CM | POA: Diagnosis not present

## 2018-12-14 DIAGNOSIS — S61452A Open bite of left hand, initial encounter: Secondary | ICD-10-CM | POA: Diagnosis not present

## 2018-12-15 DIAGNOSIS — J301 Allergic rhinitis due to pollen: Secondary | ICD-10-CM | POA: Diagnosis not present

## 2018-12-15 DIAGNOSIS — J3089 Other allergic rhinitis: Secondary | ICD-10-CM | POA: Diagnosis not present

## 2018-12-15 DIAGNOSIS — J3081 Allergic rhinitis due to animal (cat) (dog) hair and dander: Secondary | ICD-10-CM | POA: Diagnosis not present

## 2018-12-22 DIAGNOSIS — J301 Allergic rhinitis due to pollen: Secondary | ICD-10-CM | POA: Diagnosis not present

## 2018-12-22 DIAGNOSIS — J3089 Other allergic rhinitis: Secondary | ICD-10-CM | POA: Diagnosis not present

## 2018-12-22 DIAGNOSIS — J3081 Allergic rhinitis due to animal (cat) (dog) hair and dander: Secondary | ICD-10-CM | POA: Diagnosis not present

## 2018-12-29 DIAGNOSIS — J301 Allergic rhinitis due to pollen: Secondary | ICD-10-CM | POA: Diagnosis not present

## 2018-12-29 DIAGNOSIS — J3089 Other allergic rhinitis: Secondary | ICD-10-CM | POA: Diagnosis not present

## 2018-12-29 DIAGNOSIS — J3081 Allergic rhinitis due to animal (cat) (dog) hair and dander: Secondary | ICD-10-CM | POA: Diagnosis not present

## 2019-01-06 DIAGNOSIS — J3089 Other allergic rhinitis: Secondary | ICD-10-CM | POA: Diagnosis not present

## 2019-01-06 DIAGNOSIS — J3081 Allergic rhinitis due to animal (cat) (dog) hair and dander: Secondary | ICD-10-CM | POA: Diagnosis not present

## 2019-01-06 DIAGNOSIS — J301 Allergic rhinitis due to pollen: Secondary | ICD-10-CM | POA: Diagnosis not present

## 2019-01-12 DIAGNOSIS — J3089 Other allergic rhinitis: Secondary | ICD-10-CM | POA: Diagnosis not present

## 2019-01-12 DIAGNOSIS — J301 Allergic rhinitis due to pollen: Secondary | ICD-10-CM | POA: Diagnosis not present

## 2019-01-12 DIAGNOSIS — J3081 Allergic rhinitis due to animal (cat) (dog) hair and dander: Secondary | ICD-10-CM | POA: Diagnosis not present

## 2019-01-13 DIAGNOSIS — J301 Allergic rhinitis due to pollen: Secondary | ICD-10-CM | POA: Diagnosis not present

## 2019-01-19 DIAGNOSIS — J3089 Other allergic rhinitis: Secondary | ICD-10-CM | POA: Diagnosis not present

## 2019-01-19 DIAGNOSIS — J3081 Allergic rhinitis due to animal (cat) (dog) hair and dander: Secondary | ICD-10-CM | POA: Diagnosis not present

## 2019-01-19 DIAGNOSIS — J301 Allergic rhinitis due to pollen: Secondary | ICD-10-CM | POA: Diagnosis not present

## 2019-01-26 DIAGNOSIS — J301 Allergic rhinitis due to pollen: Secondary | ICD-10-CM | POA: Diagnosis not present

## 2019-01-26 DIAGNOSIS — J3089 Other allergic rhinitis: Secondary | ICD-10-CM | POA: Diagnosis not present

## 2019-01-26 DIAGNOSIS — J3081 Allergic rhinitis due to animal (cat) (dog) hair and dander: Secondary | ICD-10-CM | POA: Diagnosis not present

## 2019-02-02 DIAGNOSIS — J3089 Other allergic rhinitis: Secondary | ICD-10-CM | POA: Diagnosis not present

## 2019-02-02 DIAGNOSIS — J301 Allergic rhinitis due to pollen: Secondary | ICD-10-CM | POA: Diagnosis not present

## 2019-02-02 DIAGNOSIS — J3081 Allergic rhinitis due to animal (cat) (dog) hair and dander: Secondary | ICD-10-CM | POA: Diagnosis not present

## 2019-02-09 DIAGNOSIS — J3089 Other allergic rhinitis: Secondary | ICD-10-CM | POA: Diagnosis not present

## 2019-02-09 DIAGNOSIS — J3081 Allergic rhinitis due to animal (cat) (dog) hair and dander: Secondary | ICD-10-CM | POA: Diagnosis not present

## 2019-02-09 DIAGNOSIS — J301 Allergic rhinitis due to pollen: Secondary | ICD-10-CM | POA: Diagnosis not present

## 2019-02-16 DIAGNOSIS — J301 Allergic rhinitis due to pollen: Secondary | ICD-10-CM | POA: Diagnosis not present

## 2019-02-16 DIAGNOSIS — J3081 Allergic rhinitis due to animal (cat) (dog) hair and dander: Secondary | ICD-10-CM | POA: Diagnosis not present

## 2019-02-16 DIAGNOSIS — J3089 Other allergic rhinitis: Secondary | ICD-10-CM | POA: Diagnosis not present

## 2019-02-18 DIAGNOSIS — Z23 Encounter for immunization: Secondary | ICD-10-CM | POA: Diagnosis not present

## 2019-02-23 DIAGNOSIS — J3089 Other allergic rhinitis: Secondary | ICD-10-CM | POA: Diagnosis not present

## 2019-02-23 DIAGNOSIS — J3081 Allergic rhinitis due to animal (cat) (dog) hair and dander: Secondary | ICD-10-CM | POA: Diagnosis not present

## 2019-02-23 DIAGNOSIS — J301 Allergic rhinitis due to pollen: Secondary | ICD-10-CM | POA: Diagnosis not present

## 2019-03-02 DIAGNOSIS — J3089 Other allergic rhinitis: Secondary | ICD-10-CM | POA: Diagnosis not present

## 2019-03-02 DIAGNOSIS — J301 Allergic rhinitis due to pollen: Secondary | ICD-10-CM | POA: Diagnosis not present

## 2019-03-02 DIAGNOSIS — J3081 Allergic rhinitis due to animal (cat) (dog) hair and dander: Secondary | ICD-10-CM | POA: Diagnosis not present

## 2019-03-09 DIAGNOSIS — J301 Allergic rhinitis due to pollen: Secondary | ICD-10-CM | POA: Diagnosis not present

## 2019-03-09 DIAGNOSIS — J3089 Other allergic rhinitis: Secondary | ICD-10-CM | POA: Diagnosis not present

## 2019-03-09 DIAGNOSIS — J3081 Allergic rhinitis due to animal (cat) (dog) hair and dander: Secondary | ICD-10-CM | POA: Diagnosis not present

## 2019-03-16 DIAGNOSIS — J301 Allergic rhinitis due to pollen: Secondary | ICD-10-CM | POA: Diagnosis not present

## 2019-03-16 DIAGNOSIS — J3089 Other allergic rhinitis: Secondary | ICD-10-CM | POA: Diagnosis not present

## 2019-03-16 DIAGNOSIS — J3081 Allergic rhinitis due to animal (cat) (dog) hair and dander: Secondary | ICD-10-CM | POA: Diagnosis not present

## 2019-03-21 DIAGNOSIS — J3089 Other allergic rhinitis: Secondary | ICD-10-CM | POA: Diagnosis not present

## 2019-03-23 DIAGNOSIS — J3089 Other allergic rhinitis: Secondary | ICD-10-CM | POA: Diagnosis not present

## 2019-03-23 DIAGNOSIS — J3081 Allergic rhinitis due to animal (cat) (dog) hair and dander: Secondary | ICD-10-CM | POA: Diagnosis not present

## 2019-03-23 DIAGNOSIS — J301 Allergic rhinitis due to pollen: Secondary | ICD-10-CM | POA: Diagnosis not present

## 2019-03-30 DIAGNOSIS — J3089 Other allergic rhinitis: Secondary | ICD-10-CM | POA: Diagnosis not present

## 2019-03-30 DIAGNOSIS — J301 Allergic rhinitis due to pollen: Secondary | ICD-10-CM | POA: Diagnosis not present

## 2019-03-30 DIAGNOSIS — J3081 Allergic rhinitis due to animal (cat) (dog) hair and dander: Secondary | ICD-10-CM | POA: Diagnosis not present

## 2019-04-06 DIAGNOSIS — J301 Allergic rhinitis due to pollen: Secondary | ICD-10-CM | POA: Diagnosis not present

## 2019-04-06 DIAGNOSIS — J3089 Other allergic rhinitis: Secondary | ICD-10-CM | POA: Diagnosis not present

## 2019-04-06 DIAGNOSIS — J3081 Allergic rhinitis due to animal (cat) (dog) hair and dander: Secondary | ICD-10-CM | POA: Diagnosis not present

## 2019-04-15 DIAGNOSIS — J3089 Other allergic rhinitis: Secondary | ICD-10-CM | POA: Diagnosis not present

## 2019-04-15 DIAGNOSIS — J3081 Allergic rhinitis due to animal (cat) (dog) hair and dander: Secondary | ICD-10-CM | POA: Diagnosis not present

## 2019-04-15 DIAGNOSIS — J301 Allergic rhinitis due to pollen: Secondary | ICD-10-CM | POA: Diagnosis not present

## 2019-04-20 DIAGNOSIS — J3089 Other allergic rhinitis: Secondary | ICD-10-CM | POA: Diagnosis not present

## 2019-04-20 DIAGNOSIS — J301 Allergic rhinitis due to pollen: Secondary | ICD-10-CM | POA: Diagnosis not present

## 2019-04-20 DIAGNOSIS — J3081 Allergic rhinitis due to animal (cat) (dog) hair and dander: Secondary | ICD-10-CM | POA: Diagnosis not present

## 2019-04-27 DIAGNOSIS — J301 Allergic rhinitis due to pollen: Secondary | ICD-10-CM | POA: Diagnosis not present

## 2019-04-27 DIAGNOSIS — J3089 Other allergic rhinitis: Secondary | ICD-10-CM | POA: Diagnosis not present

## 2019-04-27 DIAGNOSIS — J3081 Allergic rhinitis due to animal (cat) (dog) hair and dander: Secondary | ICD-10-CM | POA: Diagnosis not present

## 2019-05-04 DIAGNOSIS — Z01419 Encounter for gynecological examination (general) (routine) without abnormal findings: Secondary | ICD-10-CM | POA: Diagnosis not present

## 2019-05-04 DIAGNOSIS — Z124 Encounter for screening for malignant neoplasm of cervix: Secondary | ICD-10-CM | POA: Diagnosis not present

## 2019-05-04 DIAGNOSIS — Z6824 Body mass index (BMI) 24.0-24.9, adult: Secondary | ICD-10-CM | POA: Diagnosis not present

## 2019-05-04 DIAGNOSIS — Z1231 Encounter for screening mammogram for malignant neoplasm of breast: Secondary | ICD-10-CM | POA: Diagnosis not present

## 2019-05-10 DIAGNOSIS — J301 Allergic rhinitis due to pollen: Secondary | ICD-10-CM | POA: Diagnosis not present

## 2019-05-10 DIAGNOSIS — J3089 Other allergic rhinitis: Secondary | ICD-10-CM | POA: Diagnosis not present

## 2019-05-10 DIAGNOSIS — J3081 Allergic rhinitis due to animal (cat) (dog) hair and dander: Secondary | ICD-10-CM | POA: Diagnosis not present

## 2019-05-13 DIAGNOSIS — J3089 Other allergic rhinitis: Secondary | ICD-10-CM | POA: Diagnosis not present

## 2019-05-13 DIAGNOSIS — J3081 Allergic rhinitis due to animal (cat) (dog) hair and dander: Secondary | ICD-10-CM | POA: Diagnosis not present

## 2019-05-13 DIAGNOSIS — J301 Allergic rhinitis due to pollen: Secondary | ICD-10-CM | POA: Diagnosis not present

## 2019-05-18 DIAGNOSIS — J3089 Other allergic rhinitis: Secondary | ICD-10-CM | POA: Diagnosis not present

## 2019-05-18 DIAGNOSIS — J3081 Allergic rhinitis due to animal (cat) (dog) hair and dander: Secondary | ICD-10-CM | POA: Diagnosis not present

## 2019-05-18 DIAGNOSIS — J301 Allergic rhinitis due to pollen: Secondary | ICD-10-CM | POA: Diagnosis not present

## 2019-05-25 DIAGNOSIS — J3081 Allergic rhinitis due to animal (cat) (dog) hair and dander: Secondary | ICD-10-CM | POA: Diagnosis not present

## 2019-05-25 DIAGNOSIS — J4530 Mild persistent asthma, uncomplicated: Secondary | ICD-10-CM | POA: Diagnosis not present

## 2019-05-25 DIAGNOSIS — H1045 Other chronic allergic conjunctivitis: Secondary | ICD-10-CM | POA: Diagnosis not present

## 2019-05-25 DIAGNOSIS — J301 Allergic rhinitis due to pollen: Secondary | ICD-10-CM | POA: Diagnosis not present

## 2019-05-25 DIAGNOSIS — J3089 Other allergic rhinitis: Secondary | ICD-10-CM | POA: Diagnosis not present

## 2019-05-30 DIAGNOSIS — J343 Hypertrophy of nasal turbinates: Secondary | ICD-10-CM | POA: Diagnosis not present

## 2019-05-30 DIAGNOSIS — J342 Deviated nasal septum: Secondary | ICD-10-CM | POA: Diagnosis not present

## 2019-05-30 DIAGNOSIS — J310 Chronic rhinitis: Secondary | ICD-10-CM | POA: Diagnosis not present

## 2019-05-31 DIAGNOSIS — J3089 Other allergic rhinitis: Secondary | ICD-10-CM | POA: Diagnosis not present

## 2019-05-31 DIAGNOSIS — J3081 Allergic rhinitis due to animal (cat) (dog) hair and dander: Secondary | ICD-10-CM | POA: Diagnosis not present

## 2019-05-31 DIAGNOSIS — J301 Allergic rhinitis due to pollen: Secondary | ICD-10-CM | POA: Diagnosis not present

## 2019-06-07 ENCOUNTER — Other Ambulatory Visit: Payer: Self-pay | Admitting: Otolaryngology

## 2019-06-07 DIAGNOSIS — J329 Chronic sinusitis, unspecified: Secondary | ICD-10-CM

## 2019-06-08 ENCOUNTER — Other Ambulatory Visit: Payer: Self-pay | Admitting: Otolaryngology

## 2019-06-13 ENCOUNTER — Other Ambulatory Visit: Payer: Self-pay | Admitting: Otolaryngology

## 2019-06-15 ENCOUNTER — Ambulatory Visit
Admission: RE | Admit: 2019-06-15 | Discharge: 2019-06-15 | Disposition: A | Discharge status: Home / Self Care | Payer: BC Managed Care – PPO | Source: Ambulatory Visit | Attending: Otolaryngology | Admitting: Otolaryngology

## 2019-06-15 DIAGNOSIS — J329 Chronic sinusitis, unspecified: Secondary | ICD-10-CM

## 2021-03-01 ENCOUNTER — Other Ambulatory Visit (HOSPITAL_COMMUNITY): Payer: Self-pay | Admitting: Obstetrics and Gynecology

## 2021-03-01 ENCOUNTER — Other Ambulatory Visit: Payer: Self-pay | Admitting: Obstetrics and Gynecology

## 2021-03-01 DIAGNOSIS — D259 Leiomyoma of uterus, unspecified: Secondary | ICD-10-CM

## 2021-03-06 ENCOUNTER — Encounter (HOSPITAL_COMMUNITY): Payer: Self-pay

## 2021-03-06 NOTE — Patient Instructions (Signed)
DUE TO COVID-19 ONLY ONE VISITOR IS ALLOWED TO COME WITH YOU AND STAY IN THE WAITING ROOM ONLY DURING PRE OP AND PROCEDURE DAY OF SURGERY.   TWO VISITOR  MAY VISIT WITH YOU AFTER SURGERY IN YOUR PRIVATE ROOM DURING VISITING HOURS ONLY!  YOU NEED TO HAVE A COVID 19 TEST ON_10-5-22______8am-3pm______, THIS TEST MUST BE DONE BEFORE SURGERY,     Please bring completed form with you to the COVID testing site   COVID TESTING SITE [place] TEST IS COMPLETED,  PLEASE Wear a mask when in public           Your procedure is scheduled on: 03-15-21   Report to [facility] Main  Entrance   Report to admitting at         1000 AM     Call this number if you have problems the morning of surgery [phone]   Remember: Do not eat food :After Midnight. You may have clear liquids until   0915 am then nothing by mouth    CLEAR LIQUID DIET                                                                    water Black Coffee and tea, regular and decaf                             Plain Jell-O any favor except red or purple                                  Fruit ices (not with fruit pulp)                                      Iced Popsicles                                                                     Cranberry, grape and apple juices Sports drinks like Gatorade Lightly seasoned clear broth or consume(fat free) Sugar, honey syrup   _____________________________________________________________________     BRUSH YOUR TEETH MORNING OF SURGERY AND RINSE YOUR MOUTH OUT, NO CHEWING GUM CANDY OR MINTS.     Take these medicines the morning of surgery with A SIP OF WATER: Klonopin, kelnor, claritin, singulair, propranolol, vibryd  DO NOT TAKE ANY DIABETIC MEDICATIONS DAY OF YOUR SURGERY                               You may not have any metal on your body including hair pins and              piercings  Do not wear jewelry,  make-up, lotions, powders,perfumes,  deodorant             Do not wear nail polish on your fingernails or toenails .  Do not shave  48 hours prior to surgery.           Do not bring valuables to the hospital. [place].  Contacts, dentures or bridgework may not be worn into surgery.       Patients discharged the day of surgery will not be allowed to drive home. IF YOU ARE HAVING SURGERY AND GOING HOME THE SAME DAY, YOU MUST HAVE AN ADULT TO DRIVE YOU HOME AND BE WITH YOU FOR 24 HOURS. YOU MAY GO HOME BY TAXI OR UBER OR ORTHERWISE, BUT AN ADULT MUST ACCOMPANY YOU HOME AND STAY WITH YOU FOR 24 HOURS.  Name and phone number of your driver:  Special Instructions: N/A              Please read over the following fact sheets you were given: _____________________________________________________________________             [facility] - Preparing for Surgery Before surgery, you can play an important role.  Because skin is not sterile, your skin needs to be as free of germs as possible.  You can reduce the number of germs on your skin by washing with CHG (chlorahexidine gluconate) soap before surgery.  CHG is an antiseptic cleaner which kills germs and bonds with the skin to continue killing germs even after washing. Please DO NOT use if you have an allergy to CHG or antibacterial soaps.  If your skin becomes reddened/irritated stop using the CHG and inform your nurse when you arrive at Short Stay. Do not shave (including legs and underarms) for at least 48 hours prior to the first CHG shower.  You may shave your face/neck. Please follow these instructions carefully:  1.  Shower with CHG Soap the night before surgery and the  morning of Surgery.  2.  If you choose to wash your hair, wash your hair first as usual with your  normal  shampoo.  3.  After you shampoo, rinse your hair and body thoroughly to remove the  shampoo.                            4.  Use CHG as you would any other liquid soap.  You can apply chg directly  to the skin and wash                       Gently with a scrungie or clean washcloth.  5.  Apply the CHG Soap to your body ONLY FROM THE NECK DOWN.   Do not use on face/ open                           Wound or open sores. Avoid contact with eyes, ears mouth and genitals (private parts).                       Wash face,  Genitals (private parts) with your normal soap.             6.  Wash thoroughly, paying special attention to the area where your surgery  will be performed.  7.  Thoroughly rinse  your body with warm water from the neck down.  8.  DO NOT shower/wash with your normal soap after using and rinsing off  the CHG Soap.                9.  Pat yourself dry with a clean towel.            10.  Wear clean pajamas.            11.  Place clean sheets on your bed the night of your first shower and do not  sleep with pets. Day of Surgery : Do not apply any lotions/deodorants the morning of surgery.  Please wear clean clothes to the hospital/surgery center.  FAILURE TO FOLLOW THESE INSTRUCTIONS MAY RESULT IN THE CANCELLATION OF YOUR SURGERY PATIENT SIGNATURE_________________________________  NURSE SIGNATURE__________________________________  ________________________________________________________________________

## 2021-03-08 ENCOUNTER — Other Ambulatory Visit: Payer: Self-pay

## 2021-03-08 ENCOUNTER — Encounter (HOSPITAL_COMMUNITY)
Admission: RE | Admit: 2021-03-08 | Discharge: 2021-03-08 | Disposition: A | Discharge status: Home / Self Care | Payer: No Typology Code available for payment source | Source: Ambulatory Visit | Attending: Obstetrics and Gynecology | Admitting: Obstetrics and Gynecology

## 2021-03-08 ENCOUNTER — Encounter (HOSPITAL_COMMUNITY): Payer: Self-pay

## 2021-03-08 DIAGNOSIS — Z01812 Encounter for preprocedural laboratory examination: Secondary | ICD-10-CM | POA: Insufficient documentation

## 2021-03-08 HISTORY — DX: Personal history of urinary calculi: Z87.442

## 2021-03-08 HISTORY — DX: Unspecified osteoarthritis, unspecified site: M19.90

## 2021-03-08 HISTORY — DX: Depression, unspecified: F32.A

## 2021-03-08 HISTORY — DX: Anxiety disorder, unspecified: F41.9

## 2021-03-08 LAB — CBC WITH DIFFERENTIAL/PLATELET
Abs Immature Granulocytes: 0.04 10*3/uL (ref 0.00–0.07)
Basophils Absolute: 0.1 10*3/uL (ref 0.0–0.1)
Basophils Relative: 1 %
Eosinophils Absolute: 0.1 10*3/uL (ref 0.0–0.5)
Eosinophils Relative: 1 %
HCT: 41.1 % (ref 36.0–46.0)
Hemoglobin: 14.3 g/dL (ref 12.0–15.0)
Immature Granulocytes: 0 %
Lymphocytes Relative: 26 %
Lymphs Abs: 2.6 10*3/uL (ref 0.7–4.0)
MCH: 33.5 pg (ref 26.0–34.0)
MCHC: 34.8 g/dL (ref 30.0–36.0)
MCV: 96.3 fL (ref 80.0–100.0)
Monocytes Absolute: 0.6 10*3/uL (ref 0.1–1.0)
Monocytes Relative: 6 %
Neutro Abs: 6.5 10*3/uL (ref 1.7–7.7)
Neutrophils Relative %: 66 %
Platelets: 345 10*3/uL (ref 150–400)
RBC: 4.27 MIL/uL (ref 3.87–5.11)
RDW: 12.8 % (ref 11.5–15.5)
WBC: 9.9 10*3/uL (ref 4.0–10.5)
nRBC: 0 % (ref 0.0–0.2)

## 2021-03-08 NOTE — Progress Notes (Addendum)
PCP - [facility] Cardiologist - no  PPM/ICD -  Device Orders -  Rep Notified -   Chest x-ray -  EKG -  Stress Test -  ECHO -  Cardiac Cath -   Sleep Study -  CPAP -   Fasting Blood Sugar -  Checks Blood Sugar _____ times a day  Blood Thinner Instructions: Aspirin Instructions:  ERAS Protcol - PRE-SURGERY Ensure or G2-   COVID TEST- 03-13-21 COVID vaccine -pfizer fully vaccinated and boosted  Activity-- Able to walk a flight of stairs without SOB Anesthesia review: Asthma  Patient denies shortness of breath, fever, cough and chest pain at PAT appointment   All instructions explained to the patient, with a verbal understanding of the material. Patient agrees to go over the instructions while at home for a better understanding. Patient also instructed to self quarantine after being tested for COVID-19. The opportunity to ask questions was provided.

## 2021-03-12 ENCOUNTER — Ambulatory Visit (HOSPITAL_COMMUNITY)
Admission: RE | Admit: 2021-03-12 | Discharge: 2021-03-12 | Disposition: A | Discharge status: Home / Self Care | Payer: No Typology Code available for payment source | Source: Ambulatory Visit | Attending: Obstetrics and Gynecology | Admitting: Obstetrics and Gynecology

## 2021-03-12 ENCOUNTER — Other Ambulatory Visit: Payer: Self-pay

## 2021-03-12 DIAGNOSIS — D259 Leiomyoma of uterus, unspecified: Secondary | ICD-10-CM | POA: Insufficient documentation

## 2021-03-13 ENCOUNTER — Other Ambulatory Visit: Payer: Self-pay | Admitting: Obstetrics and Gynecology

## 2021-03-13 LAB — SARS CORONAVIRUS 2 (TAT 6-24 HRS): SARS Coronavirus 2: NEGATIVE

## 2021-03-15 ENCOUNTER — Encounter (HOSPITAL_COMMUNITY): Payer: Self-pay | Admitting: Obstetrics and Gynecology

## 2021-03-15 ENCOUNTER — Other Ambulatory Visit: Payer: Self-pay

## 2021-03-15 ENCOUNTER — Encounter (HOSPITAL_COMMUNITY)
Admission: RE | Disposition: A | Discharge status: Home / Self Care | Payer: Self-pay | Source: Ambulatory Visit | Attending: Obstetrics and Gynecology

## 2021-03-15 ENCOUNTER — Ambulatory Visit (HOSPITAL_COMMUNITY): Payer: No Typology Code available for payment source | Admitting: Certified Registered Nurse Anesthetist

## 2021-03-15 ENCOUNTER — Ambulatory Visit (HOSPITAL_COMMUNITY)
Admission: RE | Admit: 2021-03-15 | Discharge: 2021-03-16 | Disposition: A | Discharge status: Home / Self Care | Payer: No Typology Code available for payment source | Source: Ambulatory Visit | Attending: Obstetrics and Gynecology | Admitting: Obstetrics and Gynecology

## 2021-03-15 DIAGNOSIS — D259 Leiomyoma of uterus, unspecified: Secondary | ICD-10-CM | POA: Insufficient documentation

## 2021-03-15 DIAGNOSIS — Z79899 Other long term (current) drug therapy: Secondary | ICD-10-CM | POA: Diagnosis not present

## 2021-03-15 DIAGNOSIS — D219 Benign neoplasm of connective and other soft tissue, unspecified: Secondary | ICD-10-CM | POA: Diagnosis present

## 2021-03-15 DIAGNOSIS — F1290 Cannabis use, unspecified, uncomplicated: Secondary | ICD-10-CM | POA: Diagnosis not present

## 2021-03-15 DIAGNOSIS — Z9889 Other specified postprocedural states: Secondary | ICD-10-CM | POA: Diagnosis present

## 2021-03-15 DIAGNOSIS — Z793 Long term (current) use of hormonal contraceptives: Secondary | ICD-10-CM | POA: Diagnosis not present

## 2021-03-15 DIAGNOSIS — N920 Excessive and frequent menstruation with regular cycle: Secondary | ICD-10-CM | POA: Diagnosis not present

## 2021-03-15 DIAGNOSIS — J45909 Unspecified asthma, uncomplicated: Secondary | ICD-10-CM | POA: Diagnosis not present

## 2021-03-15 HISTORY — PX: ROBOT ASSISTED MYOMECTOMY: SHX5142

## 2021-03-15 LAB — TYPE AND SCREEN
ABO/RH(D): B POS
Antibody Screen: NEGATIVE

## 2021-03-15 LAB — PREGNANCY, URINE: Preg Test, Ur: NEGATIVE

## 2021-03-15 LAB — ABO/RH: ABO/RH(D): B POS

## 2021-03-15 SURGERY — MYOMECTOMY, ROBOT-ASSISTED
Anesthesia: General | Site: Uterus

## 2021-03-15 MED ORDER — HYDROMORPHONE HCL 1 MG/ML IJ SOLN
1.0000 mg | INTRAMUSCULAR | Status: DC | PRN
  Administered 2021-03-15 – 2021-03-16 (×3): 1 mg via INTRAVENOUS
  Filled 2021-03-15 (×3): qty 1

## 2021-03-15 MED ORDER — PROMETHAZINE HCL 25 MG/ML IJ SOLN: 6.2500 mg | INTRAMUSCULAR | Status: DC | PRN

## 2021-03-15 MED ORDER — MEPERIDINE HCL 50 MG/ML IJ SOLN: 6.2500 mg | INTRAMUSCULAR | Status: DC | PRN

## 2021-03-15 MED ORDER — KETAMINE HCL 10 MG/ML IJ SOLN
INTRAMUSCULAR | Status: AC
  Filled 2021-03-15: qty 1

## 2021-03-15 MED ORDER — FENTANYL CITRATE (PF) 100 MCG/2ML IJ SOLN
INTRAMUSCULAR | Status: DC | PRN
  Administered 2021-03-15: 100 ug via INTRAVENOUS

## 2021-03-15 MED ORDER — SIMETHICONE 80 MG PO CHEW: 40.0000 mg | CHEWABLE_TABLET | Freq: Four times a day (QID) | ORAL | Status: DC | PRN

## 2021-03-15 MED ORDER — DIPHENHYDRAMINE HCL 50 MG/ML IJ SOLN: 25.0000 mg | Freq: Four times a day (QID) | INTRAMUSCULAR | Status: DC | PRN

## 2021-03-15 MED ORDER — METHOCARBAMOL 500 MG PO TABS
500.0000 mg | ORAL_TABLET | Freq: Four times a day (QID) | ORAL | Status: DC | PRN
  Administered 2021-03-16: 500 mg via ORAL
  Filled 2021-03-15: qty 1

## 2021-03-15 MED ORDER — DIPHENHYDRAMINE HCL 25 MG PO CAPS
25.0000 mg | ORAL_CAPSULE | Freq: Four times a day (QID) | ORAL | Status: DC | PRN
  Administered 2021-03-16: 25 mg via ORAL
  Filled 2021-03-15: qty 1

## 2021-03-15 MED ORDER — PROPOFOL 10 MG/ML IV BOLUS
INTRAVENOUS | Status: DC | PRN
  Administered 2021-03-15: 200 mg via INTRAVENOUS

## 2021-03-15 MED ORDER — LACTATED RINGERS IV SOLN: INTRAVENOUS | Status: DC

## 2021-03-15 MED ORDER — HYDROMORPHONE HCL 1 MG/ML IJ SOLN
0.2500 mg | INTRAMUSCULAR | Status: DC | PRN
  Administered 2021-03-15: 0.5 mg via INTRAVENOUS

## 2021-03-15 MED ORDER — INDIGOTINDISULFONATE SODIUM 8 MG/ML IJ SOLN
INTRAMUSCULAR | Status: AC
  Filled 2021-03-15: qty 5

## 2021-03-15 MED ORDER — LIDOCAINE 2% (20 MG/ML) 5 ML SYRINGE
INTRAMUSCULAR | Status: DC | PRN
  Administered 2021-03-15: 60 mg via INTRAVENOUS

## 2021-03-15 MED ORDER — DEXMEDETOMIDINE (PRECEDEX) IN NS 20 MCG/5ML (4 MCG/ML) IV SYRINGE
PREFILLED_SYRINGE | INTRAVENOUS | Status: DC | PRN
  Administered 2021-03-15: 8 ug via INTRAVENOUS

## 2021-03-15 MED ORDER — SCOPOLAMINE 1 MG/3DAYS TD PT72
MEDICATED_PATCH | TRANSDERMAL | Status: AC
  Administered 2021-03-15: 1.5 mg
  Filled 2021-03-15: qty 1

## 2021-03-15 MED ORDER — IBUPROFEN 400 MG PO TABS
600.0000 mg | ORAL_TABLET | Freq: Four times a day (QID) | ORAL | Status: DC | PRN
  Administered 2021-03-16: 600 mg via ORAL
  Filled 2021-03-15: qty 1

## 2021-03-15 MED ORDER — ROPIVACAINE HCL 5 MG/ML IJ SOLN
INTRAMUSCULAR | Status: AC
  Filled 2021-03-15: qty 30

## 2021-03-15 MED ORDER — CELECOXIB 200 MG PO CAPS
200.0000 mg | ORAL_CAPSULE | Freq: Once | ORAL | Status: DC
  Filled 2021-03-15: qty 1

## 2021-03-15 MED ORDER — AMISULPRIDE (ANTIEMETIC) 5 MG/2ML IV SOLN: 10.0000 mg | Freq: Once | INTRAVENOUS | Status: DC | PRN

## 2021-03-15 MED ORDER — STERILE WATER FOR INJECTION IJ SOLN
INTRAMUSCULAR | Status: AC
  Filled 2021-03-15: qty 60

## 2021-03-15 MED ORDER — ACETAMINOPHEN 500 MG PO TABS
1000.0000 mg | ORAL_TABLET | Freq: Four times a day (QID) | ORAL | Status: DC
  Administered 2021-03-15 – 2021-03-16 (×2): 1000 mg via ORAL
  Filled 2021-03-15 (×2): qty 2

## 2021-03-15 MED ORDER — ONDANSETRON HCL 4 MG/2ML IJ SOLN: 4.0000 mg | Freq: Four times a day (QID) | INTRAMUSCULAR | Status: DC | PRN

## 2021-03-15 MED ORDER — ONDANSETRON 4 MG PO TBDP: 4.0000 mg | ORAL_TABLET | Freq: Four times a day (QID) | ORAL | Status: DC | PRN

## 2021-03-15 MED ORDER — SODIUM CHLORIDE 0.9 % IR SOLN
Status: DC | PRN
  Administered 2021-03-15: 3000 mL

## 2021-03-15 MED ORDER — HYDROMORPHONE HCL 1 MG/ML IJ SOLN
INTRAMUSCULAR | Status: AC
  Filled 2021-03-15: qty 1

## 2021-03-15 MED ORDER — SOD CITRATE-CITRIC ACID 500-334 MG/5ML PO SOLN: 30.0000 mL | ORAL | Status: DC

## 2021-03-15 MED ORDER — ACETAMINOPHEN 500 MG PO TABS
1000.0000 mg | ORAL_TABLET | ORAL | Status: AC
  Administered 2021-03-15: 1000 mg via ORAL
  Filled 2021-03-15: qty 2

## 2021-03-15 MED ORDER — CELECOXIB 200 MG PO CAPS
ORAL_CAPSULE | ORAL | Status: AC
  Administered 2021-03-15: 200 mg
  Filled 2021-03-15: qty 1

## 2021-03-15 MED ORDER — MIDAZOLAM HCL 5 MG/5ML IJ SOLN
INTRAMUSCULAR | Status: DC | PRN
  Administered 2021-03-15: 2 mg via INTRAVENOUS

## 2021-03-15 MED ORDER — DOCUSATE SODIUM 100 MG PO CAPS
100.0000 mg | ORAL_CAPSULE | Freq: Two times a day (BID) | ORAL | Status: DC
  Administered 2021-03-15: 100 mg via ORAL
  Filled 2021-03-15: qty 1

## 2021-03-15 MED ORDER — CEFAZOLIN SODIUM-DEXTROSE 2-4 GM/100ML-% IV SOLN
2.0000 g | INTRAVENOUS | Status: AC
  Administered 2021-03-15: 2 g via INTRAVENOUS
  Filled 2021-03-15: qty 100

## 2021-03-15 MED ORDER — MIDAZOLAM HCL 2 MG/2ML IJ SOLN
INTRAMUSCULAR | Status: AC
  Filled 2021-03-15: qty 2

## 2021-03-15 MED ORDER — FENTANYL CITRATE (PF) 100 MCG/2ML IJ SOLN
INTRAMUSCULAR | Status: AC
  Filled 2021-03-15: qty 2

## 2021-03-15 MED ORDER — KETOROLAC TROMETHAMINE 15 MG/ML IJ SOLN
15.0000 mg | INTRAMUSCULAR | Status: AC
  Administered 2021-03-15: 15 mg via INTRAVENOUS
  Filled 2021-03-15: qty 1

## 2021-03-15 MED ORDER — OXYCODONE HCL 5 MG PO TABS
5.0000 mg | ORAL_TABLET | ORAL | Status: DC | PRN
  Administered 2021-03-15: 5 mg via ORAL
  Filled 2021-03-15: qty 1

## 2021-03-15 MED ORDER — SUGAMMADEX SODIUM 200 MG/2ML IV SOLN
INTRAVENOUS | Status: DC | PRN
  Administered 2021-03-15: 200 mg via INTRAVENOUS

## 2021-03-15 MED ORDER — DEXMEDETOMIDINE (PRECEDEX) IN NS 20 MCG/5ML (4 MCG/ML) IV SYRINGE
PREFILLED_SYRINGE | INTRAVENOUS | Status: AC
  Filled 2021-03-15: qty 5

## 2021-03-15 MED ORDER — VASOPRESSIN 20 UNIT/ML IV SOLN
INTRAVENOUS | Status: DC | PRN
  Administered 2021-03-15: 16 mL via INTRAMUSCULAR

## 2021-03-15 MED ORDER — ORAL CARE MOUTH RINSE: 15.0000 mL | Freq: Once | OROMUCOSAL | Status: AC

## 2021-03-15 MED ORDER — SCOPOLAMINE 1 MG/3DAYS TD PT72
1.0000 | MEDICATED_PATCH | TRANSDERMAL | Status: DC
  Filled 2021-03-15: qty 1

## 2021-03-15 MED ORDER — ONDANSETRON HCL 4 MG/2ML IJ SOLN
INTRAMUSCULAR | Status: DC | PRN
  Administered 2021-03-15: 4 mg via INTRAVENOUS

## 2021-03-15 MED ORDER — DEXAMETHASONE SODIUM PHOSPHATE 10 MG/ML IJ SOLN
INTRAMUSCULAR | Status: DC | PRN
  Administered 2021-03-15: 10 mg via INTRAVENOUS

## 2021-03-15 MED ORDER — CHLORHEXIDINE GLUCONATE 0.12 % MT SOLN
15.0000 mL | Freq: Once | OROMUCOSAL | Status: AC
  Administered 2021-03-15: 15 mL via OROMUCOSAL

## 2021-03-15 MED ORDER — ROCURONIUM BROMIDE 10 MG/ML (PF) SYRINGE
PREFILLED_SYRINGE | INTRAVENOUS | Status: DC | PRN
  Administered 2021-03-15 (×2): 20 mg via INTRAVENOUS
  Administered 2021-03-15: 60 mg via INTRAVENOUS
  Administered 2021-03-15: 20 mg via INTRAVENOUS

## 2021-03-15 MED ORDER — VASOPRESSIN 20 UNIT/ML IV SOLN
INTRAVENOUS | Status: AC
  Filled 2021-03-15: qty 2

## 2021-03-15 MED ORDER — KETAMINE HCL 10 MG/ML IJ SOLN
INTRAMUSCULAR | Status: DC | PRN
  Administered 2021-03-15: 20 mg via INTRAVENOUS
  Administered 2021-03-15: 40 mg via INTRAVENOUS

## 2021-03-15 MED ORDER — SODIUM CHLORIDE 0.9 % IV SOLN
INTRAVENOUS | Status: DC | PRN
  Administered 2021-03-15: 60 mL

## 2021-03-15 SURGICAL SUPPLY — 56 items
ADH SKN CLS APL DERMABOND .7 (GAUZE/BANDAGES/DRESSINGS) ×1
APL SRG 38 LTWT LNG FL B (MISCELLANEOUS) ×1
APPLICATOR ARISTA FLEXITIP XL (MISCELLANEOUS) ×1 IMPLANT
BAG SPEC RTRVL LRG 6X4 10 (ENDOMECHANICALS) ×1
BARRIER ADHS 3X4 INTERCEED (GAUZE/BANDAGES/DRESSINGS) ×1 IMPLANT
BRR ADH 4X3 ABS CNTRL BYND (GAUZE/BANDAGES/DRESSINGS) ×1
COVER BACK TABLE 60X90IN (DRAPES) ×2 IMPLANT
COVER TIP SHEARS 8 DVNC (MISCELLANEOUS) ×1 IMPLANT
COVER TIP SHEARS 8MM DA VINCI (MISCELLANEOUS) ×2
DEFOGGER SCOPE WARMER CLEARIFY (MISCELLANEOUS) ×2 IMPLANT
DERMABOND ADVANCED (GAUZE/BANDAGES/DRESSINGS) ×1
DERMABOND ADVANCED .7 DNX12 (GAUZE/BANDAGES/DRESSINGS) ×1 IMPLANT
DRAPE ARM DVNC X/XI (DISPOSABLE) ×4 IMPLANT
DRAPE COLUMN DVNC XI (DISPOSABLE) ×1 IMPLANT
DRAPE DA VINCI XI ARM (DISPOSABLE) ×8
DRAPE DA VINCI XI COLUMN (DISPOSABLE) ×2
DURAPREP 26ML APPLICATOR (WOUND CARE) ×2 IMPLANT
ELECT REM PT RETURN 15FT ADLT (MISCELLANEOUS) ×2 IMPLANT
GAUZE 4X4 16PLY ~~LOC~~+RFID DBL (SPONGE) ×4 IMPLANT
GLOVE SURG ENC MOIS LTX SZ6 (GLOVE) ×6 IMPLANT
GLOVE SURG UNDER POLY LF SZ6.5 (GLOVE) ×6 IMPLANT
GOWN STRL REUS W/ TWL LRG LVL3 (GOWN DISPOSABLE) IMPLANT
GOWN STRL REUS W/TWL LRG LVL3 (GOWN DISPOSABLE) ×10
HEMOSTAT ARISTA ABSORB 3G PWDR (HEMOSTASIS) ×1 IMPLANT
HOLDER FOLEY CATH W/STRAP (MISCELLANEOUS) ×1 IMPLANT
IRRIG SUCT STRYKERFLOW 2 WTIP (MISCELLANEOUS) ×2
IRRIGATION SUCT STRKRFLW 2 WTP (MISCELLANEOUS) ×1 IMPLANT
KIT TURNOVER KIT A (KITS) ×2 IMPLANT
LEGGING LITHOTOMY PAIR STRL (DRAPES) ×2 IMPLANT
MANIPULATOR ADVINCU DEL 2.5 PL (MISCELLANEOUS) ×1 IMPLANT
NDL SPNL 22GX7 QUINCKE BK (NEEDLE) IMPLANT
NEEDLE INSUFFLATION 120MM (ENDOMECHANICALS) ×2 IMPLANT
NEEDLE SPNL 22GX7 QUINCKE BK (NEEDLE) ×2 IMPLANT
OBTURATOR OPTICAL STANDARD 8MM (TROCAR) ×2
OBTURATOR OPTICAL STND 8 DVNC (TROCAR) ×1
OBTURATOR OPTICALSTD 8 DVNC (TROCAR) ×1 IMPLANT
PACK ROBOT WH (CUSTOM PROCEDURE TRAY) ×2 IMPLANT
PACK TRENDGUARD 450 HYBRID PRO (MISCELLANEOUS) IMPLANT
PAD OB MATERNITY 4.3X12.25 (PERSONAL CARE ITEMS) ×2 IMPLANT
PAD PREP 24X48 CUFFED NSTRL (MISCELLANEOUS) ×2 IMPLANT
POUCH LAPAROSCOPIC INSTRUMENT (MISCELLANEOUS) ×1 IMPLANT
POUCH SPECIMEN RETRIEVAL 10MM (ENDOMECHANICALS) ×1 IMPLANT
SEAL CANN UNIV 5-8 DVNC XI (MISCELLANEOUS) ×3 IMPLANT
SEAL XI 5MM-8MM UNIVERSAL (MISCELLANEOUS) ×8
SET TRI-LUMEN FLTR TB AIRSEAL (TUBING) ×2 IMPLANT
SUT VICRYL 0 UR6 27IN ABS (SUTURE) ×1 IMPLANT
SUT VICRYL RAPIDE 3 0 (SUTURE) ×4 IMPLANT
SUT VLOC 180 0 6IN GS21 (SUTURE) ×2 IMPLANT
SUT VLOC 180 0 9IN  GS21 (SUTURE) ×2
SUT VLOC 180 0 9IN GS21 (SUTURE) ×1 IMPLANT
TOWEL OR 17X26 10 PK STRL BLUE (TOWEL DISPOSABLE) ×2 IMPLANT
TRAY FOLEY MTR SLVR 14FR STAT (SET/KITS/TRAYS/PACK) ×2 IMPLANT
TRENDGUARD 450 HYBRID PRO PACK (MISCELLANEOUS) ×2
TROCAR PORT AIRSEAL 12X150 (TUBING) ×1 IMPLANT
TROCAR PORT AIRSEAL 5X120 (TROCAR) ×1 IMPLANT
WATER STERILE IRR 500ML POUR (IV SOLUTION) ×1 IMPLANT

## 2021-03-15 NOTE — Brief Op Note (Signed)
03/15/2021  3:27 PM  PATIENT:  Lynder Parents  45 y.o. female  PRE-OPERATIVE DIAGNOSIS:  Multifibroid uterus, menorrhagia  POST-OPERATIVE DIAGNOSIS:  Multifibroid uterus, menorrhagia  PROCEDURE:  Procedure(s): XI ROBOTIC ASSISTED MYOMECTOMY (N/A)  SURGEON:  Surgeon(s) and Role:    * Natacha Jepsen, Edwinna Areola, MD - Primary    * Bobbye Charleston, MD - Assisting  ANESTHESIA:   general  EBL:  116mL  BLOOD ADMINISTERED:none  DRAINS: Urinary Catheter (Foley)   LOCAL MEDICATIONS USED:  NONE  SPECIMEN:  Source of Specimen:  uterine fibroids  DISPOSITION OF SPECIMEN:  PATHOLOGY  COUNTS:  YES  TOURNIQUET:  * No tourniquets in log *  DICTATION: .Note written in EPIC  PLAN OF CARE: Admit for overnight observation  PATIENT DISPOSITION:  PACU - hemodynamically stable.   Delay start of Pharmacological VTE agent (>24hrs) due to surgical blood loss or risk of bleeding: no

## 2021-03-15 NOTE — Anesthesia Procedure Notes (Signed)
Procedure Name: Intubation Date/Time: 03/15/2021 12:10 PM Performed by: Gerald Leitz, CRNA Pre-anesthesia Checklist: Patient identified, Patient being monitored, Timeout performed, Emergency Drugs available and Suction available Patient Re-evaluated:Patient Re-evaluated prior to induction Oxygen Delivery Method: Circle system utilized Preoxygenation: Pre-oxygenation with 100% oxygen Induction Type: IV induction Ventilation: Mask ventilation without difficulty Laryngoscope Size: Mac and 3 Grade View: Grade I Tube type: Oral Tube size: 7.0 mm Number of attempts: 1 Placement Confirmation: ETT inserted through vocal cords under direct vision, positive ETCO2 and breath sounds checked- equal and bilateral Secured at: 21 cm Tube secured with: Tape Dental Injury: Teeth and Oropharynx as per pre-operative assessment

## 2021-03-15 NOTE — Anesthesia Postprocedure Evaluation (Signed)
Anesthesia Post Note  Patient: Margaretann Abate  Procedure(s) Performed: XI ROBOTIC ASSISTED MYOMECTOMY (Uterus)     Patient location during evaluation: PACU Anesthesia Type: General Level of consciousness: awake and alert Pain management: pain level controlled Vital Signs Assessment: post-procedure vital signs reviewed and stable Respiratory status: spontaneous breathing, nonlabored ventilation, respiratory function stable and patient connected to nasal cannula oxygen Cardiovascular status: blood pressure returned to baseline and stable Postop Assessment: no apparent nausea or vomiting Anesthetic complications: no   No notable events documented.  Last Vitals:  Vitals:   03/15/21 1700 03/15/21 1821  BP: 126/73 (!) 147/84  Pulse: 75 79  Resp: 12 16  Temp: 36.7 C 36.7 C  SpO2: 100% 98%    Last Pain:  Vitals:   03/15/21 1821  TempSrc: Oral  PainSc:                  Effie Berkshire

## 2021-03-15 NOTE — Op Note (Signed)
PATIENT:  Rebecca Mcdaniel  45 y.o. female with multifibroid uterus   PRE-OPERATIVE DIAGNOSIS:  Multifibroid uterus, menorrhagia   POST-OPERATIVE DIAGNOSIS:  Multifibroid uterus, menorrhagia   PROCEDURE:  Exam under anesthesia    SURGEON:  Surgeon(s) and Role:    * Carlon Davidson, Edwinna Areola, MD - Primary    * Bobbye Charleston, MD - Assisting   ANESTHESIA:   general   EBL:  141mL   BLOOD ADMINISTERED:none   DRAINS: Urinary Catheter (Foley)    LOCAL MEDICATIONS USED:  NONE   SPECIMEN:  Source of Specimen:  uterine fibroids   DISPOSITION OF SPECIMEN:  PATHOLOGY   COUNTS:  YES   PLAN OF CARE: Admit for overnight observation   PATIENT DISPOSITION:  PACU - hemodynamically stable.   Delay start of Pharmacological VTE agent (>24hrs) due to surgical blood loss or risk of bleeding: no  FINDINGS:  On bimanual exam, retroverted uterus with palpable posterior fibroid, sounded to 9cm.  On laparoscopic view, enlarged multifibroid uterus with 9 fibroids visualized.  #1 4cm posterior type 5 #2 2cm left posterior type 2-5 #3 1.5cm left posterior, inferior to left fallopian tube type 6 #4-7 1-3 cm anterior type 6 #8 2cm anterior type 5 #9 1cm very close to insertion of right fallopian tube type 6.  A total of 8 fibroids were removed, #9 was left in place due to being subserosal and proximity to fallopian tube in patient desiring fertility. The ENDOMETRIAL CAVITY WAS ENTERED when removing #2. Normal appearing ovaries and fallopian tubes bilaterally. A small perforation was appreciated in the posterior lower uterine segment, likely from cervical dilation, that was noted to be hemostatic.   PROCEDURE IN DETAIL:   The patient was appropriately consented and taken to the operating room, where general endotracheal anesthesia was administered without difficulty. She was placed in the dorsal lithotomy position with legs in stirrups. Both arms were tucked at the sides. Thromboguards were on and  cycling. Ancef 2g was given for infection prophylaxis. She was prepped and draped in normal sterile fashion.   A foley catheter was inserted and draining. A speculum was placed in the vagina. The cervix was grasped with a single-tooth tenaculum. The uterus was sounded to 9cm. The cervical os was dilated to accommodate the Advincula uterine manipulator which was inserted without difficulty    Attention was then turned to the abdomen. An 12mm incision was made at the base of the umbilicus. The veress needle was used for entry into the abdominal cavity. CO2 gas was connected and initial pressure was 13mmHg. The abdomen was insufflated to 15 mmHg. An 46mm robotic trocar was inserted through the incision into the abdomen under direct laparoscopic visualization. Intraabdominal survey confirmed no vascular or visceral injury from entry. The patient was placed in steep Trendelenburg. Findings were noted as above. An 44mm transverse incision was made in the right abdominal wall and an 81mm trocar was inserted under direct laparoscopic visualization. The same was repeated on the left side. A 38mm Airseal port was inserted on the left side in a similar fashion. The robot was docked.   Vasopressin was injected into the large posterior fibroid (#1). The uterine serosa was incised with monopolar cautery to reveal the fibroid, which was grasped with a tenaculum and carefully shelled out. The same process was repeated for the left posterior fibroid (#2). At this point, THE ENDOMETRIAL CAVITY WAS ENTERED. Fibroids #3-8 were removed in a similar fashion. Fibroid #9 was left in place due to the proximity to  the right fallopian tube.  0 v-loc suture was then used to close the myometrium and serosa at each site in 2-3 layers. Interceed was sewn into the anterior repair sites and draped over the entire uterus. Intraabdominal pressure was decreased to 33mmHg. The fibroids were placed into a specimen retrieval bag and brought through the  61mm port.  The bag was brought through the incision and the fibroids were sharply morcellated in the bag to completely remove the specimens.The robot was undocked. The laparoscope was introduced and pelvis inspected. Hemostasis was appreciated. Arista was applied to the uterus diffusely.  Ropivicaine was flushed into the peritoneal cavity. The pneumoperitoneum was released and all instruments were removed from the abdomen. The 78mm port fascia was closed with 0 vicryl in an interrupted fashion.    The incisions were then closed with 4-0 vicryl in a subcuticular fashion and covered with skin glue. All instruments were removed from the vagina. The patient tolerated the procedure well. All counts were correct times two. She was awakened from anesthesia and  was taken to the recovery room in stable condition.   Irene Pap, MD 03/15/21 5:18 PM

## 2021-03-15 NOTE — Anesthesia Preprocedure Evaluation (Addendum)
Anesthesia Evaluation  Patient identified by MRN, date of birth, ID band Patient awake    Reviewed: Allergy & Precautions, NPO status , Patient's Chart, lab work & pertinent test results  Airway Mallampati: II  TM Distance: >3 FB Neck ROM: Full    Dental no notable dental hx. (+) Dental Advisory Given, Teeth Intact   Pulmonary asthma ,    Pulmonary exam normal breath sounds clear to auscultation       Cardiovascular negative cardio ROS Normal cardiovascular exam Rhythm:Regular Rate:Normal     Neuro/Psych PSYCHIATRIC DISORDERS Anxiety Depression negative neurological ROS     GI/Hepatic negative GI ROS, Neg liver ROS,   Endo/Other  negative endocrine ROS  Renal/GU negative Renal ROS     Musculoskeletal  (+) Arthritis ,   Abdominal (+) + obese,   Peds  Hematology negative hematology ROS (+)   Anesthesia Other Findings   Reproductive/Obstetrics                            Anesthesia Physical Anesthesia Plan  ASA: 2  Anesthesia Plan: General   Post-op Pain Management:    Induction: Intravenous  PONV Risk Score and Plan: 4 or greater and Ondansetron, Dexamethasone, Propofol infusion, Midazolam, Treatment may vary due to age or medical condition and Scopolamine patch - Pre-op  Airway Management Planned: Oral ETT  Additional Equipment: None  Intra-op Plan:   Post-operative Plan: Extubation in OR  Informed Consent: I have reviewed the patients History and Physical, chart, labs and discussed the procedure including the risks, benefits and alternatives for the proposed anesthesia with the patient or authorized representative who has indicated his/her understanding and acceptance.     Dental advisory given  Plan Discussed with: CRNA  Anesthesia Plan Comments: (2 x PIV)       Anesthesia Quick Evaluation

## 2021-03-15 NOTE — Transfer of Care (Signed)
Immediate Anesthesia Transfer of Care Note  Patient: Rebecca Mcdaniel  Procedure(s) Performed: Procedure(s): XI ROBOTIC ASSISTED MYOMECTOMY (N/A)  Patient Location: PACU  Anesthesia Type:General  Level of Consciousness: Alert, Awake, Oriented  Airway & Oxygen Therapy: Patient Spontanous Breathing  Post-op Assessment: Report given to RN  Post vital signs: Reviewed and stable  Last Vitals:  Vitals:   03/15/21 1024  BP: (!) 146/78  Pulse: 79  Resp: 16  Temp: 37.6 C  SpO2: 84%    Complications: No apparent anesthesia complications

## 2021-03-15 NOTE — H&P (Signed)
Rebecca Mcdaniel is an 45 y.o. female G0P0 with multifibroid uterus and menorrhagia presenting for planned robot assisted laparoscopic myomectomy.  From initial 8/25 consult:  "Patient of Dr. Wendi Snipes with menorrhagia and frequent periods secondary to fibroids, wants to preserve fertility. Did COC taper (which helped) and now on continuous COC which doesn't seem to be helping. Declines other medication mgmt and wants to proceed with surgical mgmt."   12/24/20 [place]: GYN [place] for menorrhagia: Retroverted Uterus 6.51x7.05x4.89cm, endometrium 1.74mm. Multiple fibroids, 3 largest: #1 right 3.2x3.4x2.7cm with cystic areas, #2 right posterior 1.7x1.6x1.5cm, #3 left 2.3x2.0x1.9cm. Bilateral ovaries and adnexa wnl.  03/12/21 MRI: Uterus is retroverted with multiple leiomyomata. Dominant leiomyoma shows ovoid morphology and central increased T2 signal measuring 5.3 x 3.6 x 3.5 cm, well-circumscribed margins are noted.   This is along the posterior uterus and is largely subserosal but with broad contact with the posterior uterus.   LEFT fundal leiomyomata largest at 2.3 x 1.5 x 1.8 cm smaller subserosal leiomyoma adjacent to the LEFT ovary measures 13 mm. Smaller leiomyomata scattered about the uterus.   Endometrium grossly unremarkable on limited assessment due to artifact.   Patient's last menstrual period was 02/18/2021 (approximate).    Past Medical History:  Diagnosis Date   Anxiety    Arthritis    left knee from previous injury   Asthma    Depression    H/O seasonal allergies    History of kidney stones    Nausea     Past Surgical History:  Procedure Laterality Date   APPENDECTOMY     GANGLION CYST EXCISION Left    SEPTOPLASTY     WISDOM TOOTH EXTRACTION      No family history on file.  Social History:  reports that she has never smoked. She has never used smokeless tobacco. She reports current alcohol use. She reports current drug use. Drug: Marijuana.  Allergies: No Known  Allergies  Medications Prior to Admission  Medication Sig Dispense Refill Last Dose   B Complex-C (SUPER B COMPLEX PO) Take 1 tablet by mouth daily.   03/14/2021   clonazePAM (KLONOPIN) 0.5 MG tablet Take 0.5 mg by mouth daily.   03/15/2021 at [place] 1/35 1-35 MG-MCG tablet Take 1 tablet by mouth daily.   03/15/2021 at 0830   levocetirizine (XYZAL) 5 MG tablet Take 5 mg by mouth every evening.   03/14/2021   loratadine (CLARITIN) 10 MG tablet Take 10 mg by mouth daily.   03/15/2021 at 0830   metaxalone (SKELAXIN) 800 MG tablet Take 800 mg by mouth 2 (two) times daily as needed for muscle spasms.   Past Week   montelukast (SINGULAIR) 10 MG tablet Take 10 mg by mouth daily.   03/14/2021   Multiple Vitamins-Minerals (ONE-A-DAY WOMENS PO) Take 1 tablet by mouth daily.   03/14/2021   NON FORMULARY Allergy shots 2 injections once a week  for seasonal allergies sees Dr. Donneta Romberg   Past Week   Omega-3 Fatty Acids (FISH OIL) 1000 MG CAPS Take 1,000 mg by mouth daily.   03/14/2021   OVER THE COUNTER MEDICATION Take 2 capsules by mouth daily. Nature's Bounty Anxiety and Stress Relief   03/14/2021   propranolol (INDERAL) 10 MG tablet Take 10 mg by mouth in the morning and at bedtime.   03/15/2021 at 0830   Vilazodone HCl (VIIBRYD) 40 MG TABS Take 40 mg by mouth every morning.   03/15/2021 at 0830   vitamin C (ASCORBIC ACID) 500 MG tablet  Take 500 mg by mouth daily.   03/14/2021   amoxicillin-clavulanate (AUGMENTIN) 875-125 MG tablet Take 1 tablet by mouth every 12 (twelve) hours. (Patient not taking: No sig reported) 14 tablet 0 Not Taking    Review of Systems  Constitutional:  Negative for fever.  HENT:  Negative for sore throat.   Eyes:  Negative for pain.  Respiratory:  Negative for shortness of breath.   Cardiovascular:  Negative for chest pain.  Gastrointestinal:  Negative for abdominal pain.  Genitourinary:  Positive for menstrual problem.  Musculoskeletal:  Negative for myalgias.  Skin:  Negative  for rash.  Neurological:  Negative for headaches.   Blood pressure (!) 146/78, pulse 79, temperature 99.6 F (37.6 C), temperature source Oral, resp. rate 16, last menstrual period 02/18/2021, SpO2 96 %. Physical Exam 9/27 preop exam Chaperone Chaperone: present  Constitutional *General Appearance: healthy-appearing, well-nourished, well-developed  Head Head: normocephalic  Neck *Thyroid: no enlargement, no nodules, non-tender  Lymph Nodes *Palpation: non-tender supraclavicular nodes, non-tender axillary nodes, non-tender inguinal nodes  Cardiovascular *Auscultation: RRR, no murmur  Lungs *Respiratory Effort: no accessory muscle usage, no intercostal retractions *Auscultation: clear to auscultation, no wheezing, no rales/crackles, no rhonchi  *Breast Bilateral: no skin changes, nipple appearance: normal, no abnormal nipple secretions, no tenderness, no masses palpable  Abdomen *Inspection/Palpation/Auscultation: non-distended, no tenderness, no rebound, no guarding, soft  Female Genitalia Vulva: no masses, no atrophy, no lesions Mons: normal Labia Majora: normal, no erythema, no excoriation, no atrophy, no discoloration, no lesions Labia Minora: normal, no erythema, no excoriation, no atrophy, no discoloration, no lesions Introitus: normal *Vagina: normal, no discharge, no blood present, no erythema, no atrophy, no lesions, no ulcers, no masses, no tenderness (thin white discharge, physiologic appearing) *Cervix: grossly normal, no lesions, no discharge, no bleeding, no cervical motion tenderness *Uterus: midline, mobile, non-tender, contour irregular, fibroids, anteverted *Urethral Meatus/ Urethra: normal meatus *Adnexa/Parametria: no mass palpable, no tenderness  Extremities Legs: no calf tenderness  Neurological System Impressions: motor: no deficits, sensory: no deficits  Psychiatric *Orientation: to person, to place, to time *Mood and Affect: active and  alert, normal mood, normal affect Results for orders placed or performed during the hospital encounter of 03/15/21 (from the past 24 hour(s))  Pregnancy, urine     Status: None   Collection Time: 03/15/21 10:12 AM  Result Value Ref Range   Preg Test, Ur NEGATIVE NEGATIVE  ABO/Rh     Status: None   Collection Time: 03/15/21 10:30 AM  Result Value Ref Range   ABO/RH(D)      B POS Performed at [facility], [place] [address] [place],  [address]     No results found.  Assessment/Plan: 44Y P0 with multifibroid uterus and menorrhagia, desire for fertility preservation, here for exam under anesthesia, robot assisted laparoscopic myomectomy. Risks including infection, bleeding, damage to surrounding organs, laparotomy, inability to remove all fibroids, continued menorrhagia reviewed. No change in medical history. Ancef 2g. Plan for overnight extended recovery.   Rowland Lathe 03/15/2021, 11:44 AM

## 2021-03-16 ENCOUNTER — Encounter (HOSPITAL_COMMUNITY): Payer: Self-pay | Admitting: Obstetrics and Gynecology

## 2021-03-16 DIAGNOSIS — D259 Leiomyoma of uterus, unspecified: Secondary | ICD-10-CM | POA: Diagnosis not present

## 2021-03-16 LAB — CBC
HCT: 33.9 % — ABNORMAL LOW (ref 36.0–46.0)
Hemoglobin: 11.6 g/dL — ABNORMAL LOW (ref 12.0–15.0)
MCH: 32.6 pg (ref 26.0–34.0)
MCHC: 34.2 g/dL (ref 30.0–36.0)
MCV: 95.2 fL (ref 80.0–100.0)
Platelets: 239 10*3/uL (ref 150–400)
RBC: 3.56 MIL/uL — ABNORMAL LOW (ref 3.87–5.11)
RDW: 12.5 % (ref 11.5–15.5)
WBC: 15.4 10*3/uL — ABNORMAL HIGH (ref 4.0–10.5)
nRBC: 0 % (ref 0.0–0.2)

## 2021-03-16 MED ORDER — HYDROMORPHONE HCL 2 MG PO TABS: 2.0000 mg | ORAL_TABLET | ORAL | 0 refills | Status: AC | PRN

## 2021-03-16 MED ORDER — ACETAMINOPHEN 325 MG PO TABS: 650.0000 mg | ORAL_TABLET | Freq: Four times a day (QID) | ORAL | Status: AC | PRN

## 2021-03-16 MED ORDER — IBUPROFEN 200 MG PO TABS: 600.0000 mg | ORAL_TABLET | Freq: Four times a day (QID) | ORAL | Status: AC | PRN

## 2021-03-16 NOTE — Discharge Summary (Signed)
Physician Discharge Summary  Patient ID: Rebecca Mcdaniel MRN: [id number] DOB/AGE: 1976-05-20 45 y.o.  Admit date: 03/15/2021 Discharge date: 03/16/2021  Admission Diagnoses: Uterine fibroids, menorrhagia  Discharge Diagnoses:  Active Problems:   Leiomyoma   H/O myomectomy   Discharged Condition: good  Hospital Course:  03/15/21 admitted for scheduled surgery. Underwent robot assisted laparoscopic myomectomy. Stayed overnight for extended recovery 03/16/21 POD1 meeting postop milestones. Discharged home  Consults: None  Significant Diagnostic Studies:  CBC Latest Ref Rng & Units 03/16/2021 03/08/2021 09/07/2011  WBC 4.0 - 10.5 K/uL 15.4(H) 9.9 -  Hemoglobin 12.0 - 15.0 g/dL 11.6(L) 14.3 12.9  Hematocrit 36.0 - 46.0 % 33.9(L) 41.1 38.0  Platelets 150 - 400 K/uL 239 345 -     Treatments: surgery: as above  Discharge Exam: Blood pressure 126/72, pulse 75, temperature 97.9 F (36.6 C), temperature source Oral, resp. rate 16, height 5\' 5"  (1.651 m), weight 78.9 kg, last menstrual period 02/18/2021, SpO2 98 %. Gen: well appearing resting in bed Face: small petechiae around eyes Lungs: nonlabored respirations Abd: soft, minimally distended, appropriately tender, no rebound or guarding. Lap incisions clean/dry/intact x 4 Ext: no calf edema or tenderness  Disposition: Discharge disposition: 01-Home or Self Care       Discharge Instructions     Call MD for:  difficulty breathing, headache or visual disturbances   Complete by: As directed    Call MD for:  extreme fatigue   Complete by: As directed    Call MD for:  hives   Complete by: As directed    Call MD for:  persistant dizziness or light-headedness   Complete by: As directed    Call MD for:  persistant nausea and vomiting   Complete by: As directed    Call MD for:  redness, tenderness, or signs of infection (pain, swelling, redness, odor or green/yellow discharge around incision site)   Complete by: As directed    Call  MD for:  severe uncontrolled pain   Complete by: As directed    Call MD for:  temperature >100.4   Complete by: As directed    Diet - low sodium heart healthy   Complete by: As directed    Driving Restrictions   Complete by: As directed    No driving while taking narcotics   Increase activity slowly   Complete by: As directed    Lifting restrictions   Complete by: As directed    No heavy lifting > 15lbs   No dressing needed   Complete by: As directed    Sexual Activity Restrictions   Complete by: As directed    Nothing in vagina until cleared by your physician      Allergies as of 03/16/2021   No Known Allergies      Medication List     STOP taking these medications    amoxicillin-clavulanate 875-125 MG tablet Commonly known as: AUGMENTIN       TAKE these medications    acetaminophen 325 MG tablet Commonly known as: TYLENOL Take 2 tablets (650 mg total) by mouth every 6 (six) hours as needed.   clonazePAM 0.5 MG tablet Commonly known as: KLONOPIN Take 0.5 mg by mouth daily.   Fish Oil 1000 MG Caps Take 1,000 mg by mouth daily.   HYDROmorphone 2 MG tablet Commonly known as: Dilaudid Take 1 tablet (2 mg total) by mouth every 4 (four) hours as needed for severe pain.   ibuprofen 200 MG tablet Commonly known as: ADVIL Take  3 tablets (600 mg total) by mouth every 6 (six) hours as needed (for mild pain not relieved by other medications.).   [place] 1/35 1-35 MG-MCG tablet Generic drug: ethynodiol-ethinyl estradiol Take 1 tablet by mouth daily.   levocetirizine 5 MG tablet Commonly known as: XYZAL Take 5 mg by mouth every evening.   loratadine 10 MG tablet Commonly known as: CLARITIN Take 10 mg by mouth daily.   metaxalone 800 MG tablet Commonly known as: SKELAXIN Take 800 mg by mouth 2 (two) times daily as needed for muscle spasms.   montelukast 10 MG tablet Commonly known as: SINGULAIR Take 10 mg by mouth daily.   NON FORMULARY Allergy shots 2  injections once a week  for seasonal allergies sees Dr. Donneta Romberg   ONE-A-DAY WOMENS PO Take 1 tablet by mouth daily.   OVER THE COUNTER MEDICATION Take 2 capsules by mouth daily. Nature's Bounty Anxiety and Stress Relief   propranolol 10 MG tablet Commonly known as: INDERAL Take 10 mg by mouth in the morning and at bedtime.   SUPER B COMPLEX PO Take 1 tablet by mouth daily.   Vilazodone HCl 40 MG Tabs Commonly known as: VIIBRYD Take 40 mg by mouth every morning.   vitamin C 500 MG tablet Commonly known as: ASCORBIC ACID Take 500 mg by mouth daily.               Discharge Care Instructions  (From admission, onward)           Start     Ordered   03/16/21 0000  No dressing needed        03/16/21 1610            Follow-up Information     Rowland Lathe, MD Follow up in 2 week(s).   Specialty: Obstetrics and Gynecology Contact information: [address] [place] [address] [phone]                 Signed: Rowland Lathe 03/16/2021, 8:58 AM

## 2021-03-16 NOTE — Progress Notes (Signed)
Reviewed written d/c instructions w pt and all questions answered. She verbalized understanding. D/C per w/c w all belongings in stable condition. 

## 2021-03-16 NOTE — Progress Notes (Signed)
GYN PROGRESS NOTE  S: Rebecca Mcdaniel is doing well this morning. She is ambulating, voiding, tolerating PO. Pain is controlled on current regimen. She had some itching after taking oxycodone overnight but did better with dilaudid. Vaginal bleeding is minimal. No chest pain, shortness of breath, calf pain  Today's Vitals   03/16/21 0125 03/16/21 0518 03/16/21 0600 03/16/21 0800  BP: 101/62 126/72    Pulse: 96 75    Resp: 16 16    Temp: 99.3 F (37.4 C) 97.9 F (36.6 C)    TempSrc: Oral Oral    SpO2: 98% 98%    Weight:      Height:      PainSc:   0-No pain 6    Body mass index is 28.96 kg/m.  CBC Latest Ref Rng & Units 03/16/2021 03/08/2021 09/07/2011  WBC 4.0 - 10.5 K/uL 15.4(H) 9.9 -  Hemoglobin 12.0 - 15.0 g/dL 11.6(L) 14.3 12.9  Hematocrit 36.0 - 46.0 % 33.9(L) 41.1 38.0  Platelets 150 - 400 K/uL 239 345 -     Gen: well appearing resting in bed Face: small petechiae around eyes Lungs: nonlabored respirations Abd: soft, minimally distended, appropriately tender, no rebound or guarding. Lap incisions clean/dry/intact x 4 Ext: no calf edema or tenderness  A/P POD1 s/p RA lap myomectomy, meeting postop milestones for discharge. Discussed petechiae from Trendelenburg position, should continue to resolve.  Discharge instructions reviewed. Will send in new rx for dilaudid PRN. Follow up in office in 2 weeks.   Luther Redo, MD 03/16/21 9:01 AM

## 2021-03-18 LAB — SURGICAL PATHOLOGY

## 2022-06-19 ENCOUNTER — Ambulatory Visit: Payer: No Typology Code available for payment source | Admitting: Podiatry

## 2022-06-19 ENCOUNTER — Ambulatory Visit (INDEPENDENT_AMBULATORY_CARE_PROVIDER_SITE_OTHER): Payer: No Typology Code available for payment source

## 2022-06-19 DIAGNOSIS — M79671 Pain in right foot: Secondary | ICD-10-CM

## 2022-06-19 DIAGNOSIS — M62171 Other rupture of muscle (nontraumatic), right ankle and foot: Secondary | ICD-10-CM

## 2022-06-19 DIAGNOSIS — M62179 Other rupture of muscle (nontraumatic), unspecified ankle and foot: Secondary | ICD-10-CM

## 2022-06-19 DIAGNOSIS — G5751 Tarsal tunnel syndrome, right lower limb: Secondary | ICD-10-CM

## 2022-06-19 NOTE — Progress Notes (Signed)
Subjective:   Patient ID: Rebecca Mcdaniel, female   DOB: 47 y.o.   MRN: [id number]   HPI Chief Complaint  Patient presents with   Foot Pain    R foot in pain in heel with numbness    47 year old female presents the above concerns.  In July 2023, had 3 steroid injections 2 weeks apart and then oral prednisone and naproxen. She went to PT which helped some. She then went back on prednisone.   She went to 2 amusement parks a few weeks apart and she did a lot of walking and standing in line and that is when noticed it after the 2nd amusement park.   It hurts intermittently, but every day it hurts. It varies depending on the time of the day. It hurts in the mornings at times but some times not. She cannot figure out the triggers.   She gets numbness which started after 2nd steroid injection. The numbness was her whole foot but now more localised on the heel. She gets sharp pains as well.   She has fibromyaligia.   Review of Systems  All other systems reviewed and are negative.  Past Medical History:  Diagnosis Date   Anxiety    Arthritis    left knee from previous injury   Asthma    Depression    H/O seasonal allergies    History of kidney stones    Nausea     Past Surgical History:  Procedure Laterality Date   APPENDECTOMY     GANGLION CYST EXCISION Left    ROBOT ASSISTED MYOMECTOMY N/A 03/15/2021   Procedure: XI ROBOTIC ASSISTED MYOMECTOMY;  Surgeon: Rowland Lathe, MD;  Location: [facility];  Service: Gynecology;  Laterality: N/A;   SEPTOPLASTY     WISDOM TOOTH EXTRACTION       Current Outpatient Medications:    acetaminophen (TYLENOL) 325 MG tablet, Take 2 tablets (650 mg total) by mouth every 6 (six) hours as needed., Disp: , Rfl:    B Complex-C (SUPER B COMPLEX PO), Take 1 tablet by mouth daily., Disp: , Rfl:    clonazePAM (KLONOPIN) 0.5 MG tablet, Take 0.5 mg by mouth daily., Disp: , Rfl:    HYDROmorphone (DILAUDID) 2 MG tablet, Take 1 tablet (2 mg total) by mouth  every 4 (four) hours as needed for severe pain., Disp: 10 tablet, Rfl: 0   ibuprofen (ADVIL) 200 MG tablet, Take 3 tablets (600 mg total) by mouth every 6 (six) hours as needed (for mild pain not relieved by other medications.)., Disp: , Rfl:    KELNOR 1/35 1-35 MG-MCG tablet, Take 1 tablet by mouth daily., Disp: , Rfl:    levocetirizine (XYZAL) 5 MG tablet, Take 5 mg by mouth every evening., Disp: , Rfl:    loratadine (CLARITIN) 10 MG tablet, Take 10 mg by mouth daily., Disp: , Rfl:    metaxalone (SKELAXIN) 800 MG tablet, Take 800 mg by mouth 2 (two) times daily as needed for muscle spasms., Disp: , Rfl:    montelukast (SINGULAIR) 10 MG tablet, Take 10 mg by mouth daily., Disp: , Rfl:    Multiple Vitamins-Minerals (ONE-A-DAY WOMENS PO), Take 1 tablet by mouth daily., Disp: , Rfl:    NON FORMULARY, Allergy shots 2 injections once a week  for seasonal allergies sees Dr. Donneta Romberg, Disp: , Rfl:    Omega-3 Fatty Acids (FISH OIL) 1000 MG CAPS, Take 1,000 mg by mouth daily., Disp: , Rfl:    OVER THE COUNTER MEDICATION, Take  2 capsules by mouth daily. Nature's Bounty Anxiety and Stress Relief, Disp: , Rfl:    propranolol (INDERAL) 10 MG tablet, Take 10 mg by mouth in the morning and at bedtime., Disp: , Rfl:    Vilazodone HCl (VIIBRYD) 40 MG TABS, Take 40 mg by mouth every morning., Disp: , Rfl:    vitamin C (ASCORBIC ACID) 500 MG tablet, Take 500 mg by mouth daily., Disp: , Rfl:   Allergies  Allergen Reactions   Dog Epithelium Allergy Skin Test Cough, Other (See Comments) and Shortness Of Breath   Molds & Smuts Cough, Other (See Comments) and Shortness Of Breath   Cat Hair Extract Other (See Comments)    Other Reaction(s): respiratory distress   Grass Pollen(K-O-R-T-Swt Vern) Itching    Other Reaction(s): cough, headache   Pollen Extract Other (See Comments)    Other Reaction(s): headache   Short Ragweed Pollen Ext     Other Reaction(s): cough, headache          Objective:  Physical Exam   General: AAO x3, NAD  Dermatological: Skin is warm, dry and supple bilateral.There are no open sores, no preulcerative lesions, no rash or signs of infection present.  Vascular: Dorsalis Pedis artery and Posterior Tibial artery pedal pulses are 2/4 bilateral with immedate capillary fill time. There is no pain with calf compression, swelling, warmth, erythema.   Neruologic: Grossly intact via light touch bilateral.  There is a positive Tinel's sign on the right side.  Musculoskeletal: She does get discomfort on the plantar aspect the calcaneus and insertion of plantar fascia on the insertion of the calcaneus.  There is also discomfort along the medial aspect along the tarsal tunnel area.  There is no pain with ankle or subtalar joint range of motion.  Gait: Unassisted, Nonantalgic.       Assessment:   Plantar fasciitis but also concern for tarsal tunnel syndrome right side     Plan:  -Treatment options discussed including all alternatives, risks, and complications -Etiology of symptoms were discussed -X-rays were obtained and reviewed with the patient.  3 views of the right foot were obtained.  No evidence of acute fracture. -CAM boot was dispensed for immobilization to help facilitate soft tissue healing and offloading given her multiple treatments. -Also given her multiple treatments over the last 6 months and not having any significant relief I am going to order a MRI of the ankle to look at the tarsal tunnel as well as the plantar fascia to rule out tearing. -Will follow-up after the MRI to discuss further treatment options MRI.   Trula Slade DPM

## 2022-06-19 NOTE — Patient Instructions (Addendum)
I have ordered a MRI of the right ankle. If you do not hear for them about scheduling within the next 1 week, or you have any questions please give [place] a call at [phone].   Walking Boot, Adult  A walking boot holds your foot or ankle in place after an injury or a medical procedure. This helps with healing and prevents further injury. It has a hard, rigid outer frame that limits movement and supports your foot and leg. The inner lining is a layer of padded material. Walking boots also have adjustable straps to secure them over the foot and leg. A walking boot may be prescribed if you can put weight (bear weight) on your injured foot. How much you can walk while wearing the boot will depend on the type and severity of your injury. How to put on a walking boot There are different types of walking boots. Each type has specific instructions about how to wear it properly. Follow instructions from your health care provider, such as: Ask someone to help you put on the boot, if needed. Sit to put on your boot. Doing this is more comfortable and helps to prevent falls. Open up the boot fully. Place your foot into the boot so your heel rests against the back. Your toes should be supported by the base of the boot. They should not hang over the front edge. Adjust the straps so the boot fits securely but is not too tight. Do not bend the hard frame of the boot to get a good fit. How to walk with a walking boot How much you can walk will depend on your injury. Some tips for managing with a boot include: Do not try to walk without wearing the boot unless your health care provider approves. Use other assistive walking devices, including crutches or canes, as told by your health care provider. On your uninjured foot, wear a shoe with a heel that is close to the height of the walking boot. Be careful when walking on surfaces that are uneven or wet. How to reduce swelling while using a walking boot  Rest your  injured foot or leg as much as possible. If directed, put ice on the injured area. To do this: Put ice in a plastic bag. Place a towel between your skin and the bag. Leave the ice on for 20 minutes, 2-3 times a day. Remove the ice if your skin turns bright red. This is very important. If you cannot feel pain, heat, or cold, you have a greater risk of damage to the area. Keep your injured foot or leg raised (elevated) above the level of your heart for at least 2?3 hours each day or as told by your health care provider. If swelling gets worse, loosen the boot. Rest and elevate your foot and leg. How to care for your skin and foot while using a walking boot Wear a long sock to protect your foot and leg from rubbing inside the boot. Take off the boot one time each day to check the injured area. Check your foot, the surrounding skin, and your leg to make sure there are no sores, rashes, swelling, or wounds. The skin should be a healthy color, not pale or blue. Try to notice if your walking pattern (gait) in the boot is fairly normal and that you are walking without a noticeable limp. Follow instructions from your health care provider about taking care of your incision or wound, if this applies. Clean and  wash the injured area as told by your health care provider. Gently dry your foot and leg before putting the boot back on. Removing your walking boot Follow directions from your health care provider for removing the walking boot. Generally, it is okay to remove your walking boot: When you are resting or sleeping. To clean your foot and leg. How to keep the walking boot clean Do not put any part of the boot in a washing machine or dryer. Do not use chemical cleaning products. These could irritate your skin, especially if you have a wound or an incision. Do not soak the liner of the boot. Use a washcloth with mild soap and water to clean the frame and the liner of the boot by hand. Allow the boot to  air-dry completely before you put it back on your foot. Follow these instructions at home: Activity Your activity will be restricted depending on the type and severity of your injury. Follow instructions from your health care provider. Also: Bathe and shower as told by your health care provider. Do not do any activities that could make your injury worse. Do not drive if your affected foot is the one that you use for driving. Contact a health care provider if: The boot is cracked or damaged. The boot does not fit properly. Your foot or leg hurts. You have a rash, sore, or open sore (ulcer) on your foot or leg. The skin on your foot or leg is pale. You have a wound or incision on the foot and it is getting worse. Your skin becomes painful, red, or irritated. Your swelling does not get better or it gets worse. Get help right away if: You have numbness in your foot or leg. The skin on your foot or leg is cold, blue, or gray. Summary A walking boot holds your foot or ankle in place after an injury or a medical procedure. There are different types of walking boots. Follow instructions about how to correctly wear your boot. Ask someone to help you put on the boot, if needed. It is important to check your skin and foot every day. Call your health care provider if you notice a rash or sore on your foot or leg. This information is not intended to replace advice given to you by your health care provider. Make sure you discuss any questions you have with your health care provider. Document Revised: 03/19/2020 Document Reviewed: 03/19/2020 Elsevier Patient Education  [place].

## 2022-06-30 ENCOUNTER — Encounter: Payer: Self-pay | Admitting: Podiatry

## 2022-07-01 ENCOUNTER — Other Ambulatory Visit: Payer: No Typology Code available for payment source

## 2022-07-07 ENCOUNTER — Other Ambulatory Visit: Payer: Self-pay

## 2022-07-07 DIAGNOSIS — M79671 Pain in right foot: Secondary | ICD-10-CM

## 2022-07-07 DIAGNOSIS — M62179 Other rupture of muscle (nontraumatic), unspecified ankle and foot: Secondary | ICD-10-CM

## 2022-07-10 ENCOUNTER — Telehealth: Payer: Self-pay | Admitting: *Deleted

## 2022-07-10 NOTE — Telephone Encounter (Signed)
Patient is calling because her MRI is not in network w/ [facility](where it was orginally sent), not in her [place] [facility] network,was supposed to be sent to [facility], called them and it is not there.

## 2022-07-10 NOTE — Telephone Encounter (Signed)
Was able to finally able to get the fax to go through and the patient is aware of the delay.

## 2022-07-10 NOTE — Telephone Encounter (Signed)
I have been having trouble getting the referral over to them due to there fax machine has not been working.

## 2022-07-15 NOTE — Telephone Encounter (Signed)
Patient is calling for status of her MRI authorization, explained  thru voice message that paper work was finally able to go thru to insurance, apologized for delay.

## 2022-08-07 ENCOUNTER — Telehealth: Payer: Self-pay | Admitting: *Deleted

## 2022-08-07 NOTE — Telephone Encounter (Signed)
No prior authorization for study per insurance, ref# [id number]: Frankey Poot M,updated [facility]-08/07/22.

## 2022-08-07 NOTE — Telephone Encounter (Addendum)
[facility] is requesting prior approval for a scheduled appointment(03/01),please fax approval [phone]

## 2022-08-20 ENCOUNTER — Encounter: Payer: Self-pay | Admitting: Podiatry

## 2022-08-26 ENCOUNTER — Ambulatory Visit: Payer: No Typology Code available for payment source | Admitting: Podiatry

## 2022-08-26 DIAGNOSIS — M722 Plantar fascial fibromatosis: Secondary | ICD-10-CM | POA: Diagnosis not present

## 2022-08-26 DIAGNOSIS — M792 Neuralgia and neuritis, unspecified: Secondary | ICD-10-CM

## 2022-08-26 NOTE — Progress Notes (Signed)
Subjective: Chief Complaint  Patient presents with   Results    Right foot   47 year old female presents the office today for to further discuss MRI results of the right foot and for follow-up evaluation.  She said that she is feeling a little bit better and numbness is also better.  She is wearing the cam boot.  She has had multiple other conservative treatments.  No recent injury or changes otherwise.  She does not porting any radiating pain up the leg or into the foot.  No back pain.   Objective: AAO x3, NAD DP/PT pulses palpable bilaterally, CRT less than 3 seconds NEGATIVE TINEL SIGN TODAY Tenderness to palpation along the plantar medial tubercle of the calcaneus at the insertion of plantar fascia on the left foot. There is no pain along the course of the plantar fascia within the arch of the foot. Plantar fascia appears to be intact. There is no pain with lateral compression of the calcaneus or pain with vibratory sensation. There is no pain along the course or insertion of the achilles tendon. No other areas of tenderness to bilateral lower extremities. No pain with calf compression, swelling, warmth, erythema  Assessment: Left heel pain, Planter fasciitis; neuritis  Plan: -All treatment options discussed with the patient including all alternatives, risks, complications.  -I reviewed the MRI with her.  Discussed different treatment options.  She has a negative Tinel sign.  I think her symptoms are more localized to the localized neuritis. At this time we will work on other treatment options and work and start with EPAT.  She was scheduled for this. -Patient encouraged to call the office with any questions, concerns, change in symptoms.   Rebecca Mcdaniel DPM   She feels like it is getting better the pain and numbness. She gets some burning to the heel as well.

## 2022-09-08 ENCOUNTER — Ambulatory Visit (INDEPENDENT_AMBULATORY_CARE_PROVIDER_SITE_OTHER): Payer: No Typology Code available for payment source | Admitting: *Deleted

## 2022-09-08 DIAGNOSIS — M722 Plantar fascial fibromatosis: Secondary | ICD-10-CM

## 2022-09-08 NOTE — Patient Instructions (Signed)

## 2022-09-08 NOTE — Progress Notes (Signed)
Patient presents for the 1st EPAT treatment today with complaint of plantar heel pain right. Diagnosed with plantar fasciitis by Dr. Jacqualyn Posey. This has been ongoing for several months. The patient has tried ice, stretching, NSAIDS and supportive shoe gear with no long term relief.   Most of the pain is located plantar heel and some arch right.  ESWT administered and tolerated well.Treatment settings initiated at:   Energy: 12  Ended treatment session today with 3000 shocks at the following settings:   Energy: 12  Frequency: 5.0  Joules: 11.77   Reviewed post EPAT instructions. Advised to avoid ice and NSAIDs throughout the treatment process and to utilize boot or supportive shoes for at least the next 3 days.  Follow up for 2nd  treatment in 1 week.

## 2022-09-16 ENCOUNTER — Other Ambulatory Visit: Payer: No Typology Code available for payment source

## 2022-09-18 ENCOUNTER — Ambulatory Visit (INDEPENDENT_AMBULATORY_CARE_PROVIDER_SITE_OTHER): Payer: No Typology Code available for payment source

## 2022-09-18 DIAGNOSIS — M722 Plantar fascial fibromatosis: Secondary | ICD-10-CM

## 2022-09-18 NOTE — Progress Notes (Signed)
Patient presents for the 2nd EPAT treatment today with complaint of plantar heel pain right. Diagnosed with plantar fasciitis by Dr. Ardelle Anton. This has been ongoing for several months. The patient has tried ice, stretching, NSAIDS and supportive shoe gear with no long term relief.   Most of the pain is located plantar heel and some arch right.  ESWT administered and tolerated well.Treatment settings initiated at:   Energy: 12  Ended treatment session today with 3000 shocks at the following settings:   Energy: 12  Frequency: 5.0  Joules: 11.77   Reviewed post EPAT instructions. Advised to avoid ice and NSAIDs throughout the treatment process and to utilize boot or supportive shoes for at least the next 3 days.  Follow up for 3rd  treatment in 2 weeks.

## 2022-10-06 ENCOUNTER — Ambulatory Visit (INDEPENDENT_AMBULATORY_CARE_PROVIDER_SITE_OTHER): Payer: Self-pay | Admitting: *Deleted

## 2022-10-06 DIAGNOSIS — M722 Plantar fascial fibromatosis: Secondary | ICD-10-CM

## 2022-10-06 NOTE — Progress Notes (Signed)
Patient presents for the 2nd EPAT treatment today with complaint of plantar heel pain right. Diagnosed with plantar fasciitis by Dr. Ardelle Anton. This has been ongoing for several months. The patient has tried ice, stretching, NSAIDS and supportive shoe gear with no long term relief.   Most of the pain is located plantar heel, which she says feels more like a bruise. The arch pain she says is much better.  ESWT administered and tolerated well.Treatment settings initiated at:   Energy: 15  Ended treatment session today with 3000 shocks at the following settings:   Energy: 15  Frequency: 5.0  Joules: 14.72   Reviewed post EPAT instructions. Advised to avoid ice and NSAIDs throughout the treatment process and to utilize boot or supportive shoes for at least the next 3 days.  Follow up for 4th treatment in 2 weeks, if she feels she needs it.

## 2022-10-31 ENCOUNTER — Other Ambulatory Visit: Payer: No Typology Code available for payment source

## 2022-12-25 IMAGING — MR MR PELVIS W/O CM
11 series · 43 of 48 positions shown · non-contrast
Comparison: CT abdomen and pelvis from Monday August, 2011.

CLINICAL DATA: Uterine leiomyomata.

EXAM:
MRI PELVIS WITHOUT CONTRAST
TECHNIQUE: Multiplanar multisequence MR imaging of the pelvis was performed. No
intravenous contrast was administered.

[Series 3: T2 · coronal · 6.0mm · 1.56mm/px · 3 of 30 slices shown (1 of 4)]
[im 1/30]
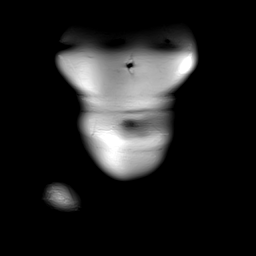
[im 15/30]
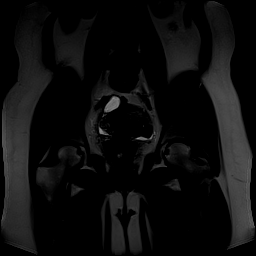
[im 30/30]
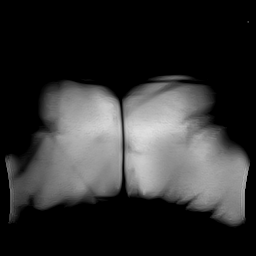

[Series 4: T2 · axial · 5.0mm · 0.51mm/px · z∈[-30,+168]mm · 3 of 34 slices shown (2 of 4)]
[im 1/34]
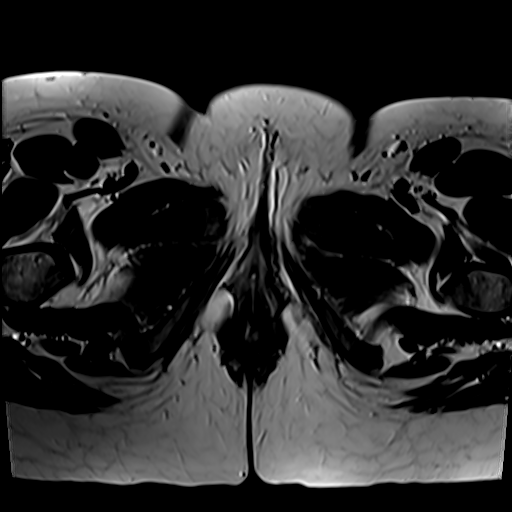
[im 17/34]
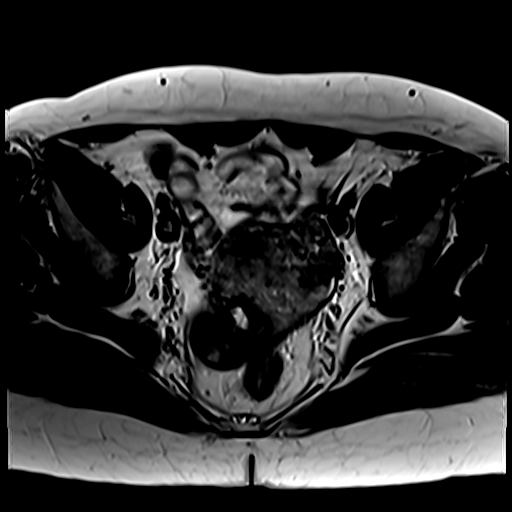
[im 34/34]
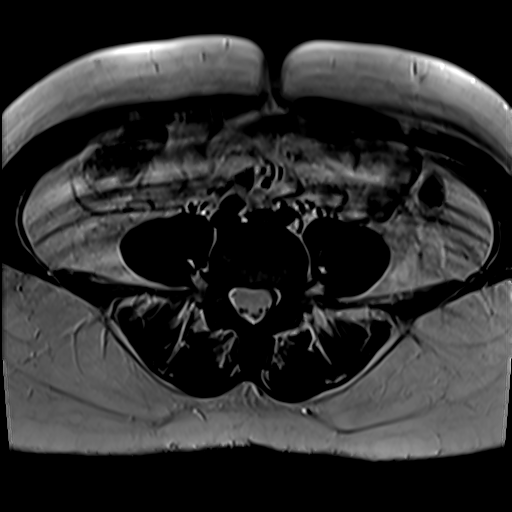

[Series 5: T2 · coronal · 4.0mm · 0.51mm/px · 3 of 34 slices shown (3 of 4)]
[im 1/34]
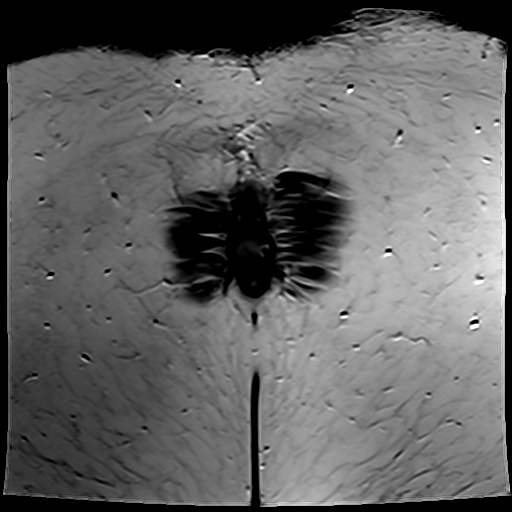
[im 17/34]
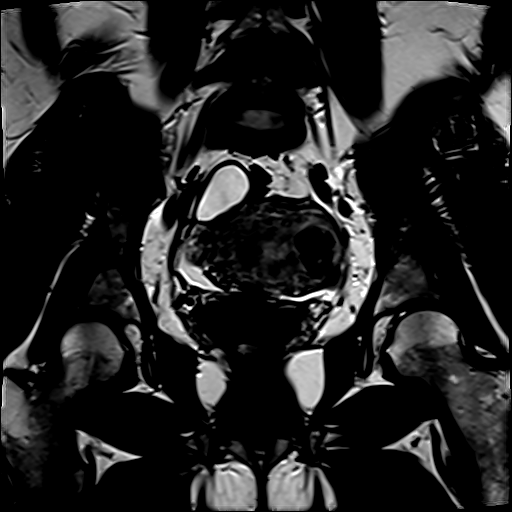
[im 34/34]
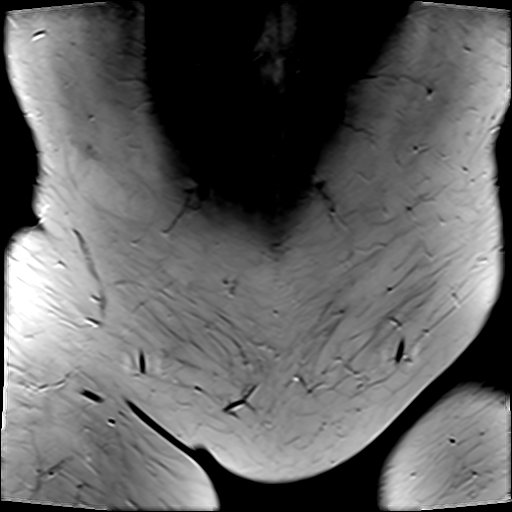

[Series 6: T2 fat-sat · axial · 5.0mm · 0.51mm/px · z∈[-30,+168]mm · 3 of 34 slices shown]
[im 1/34]
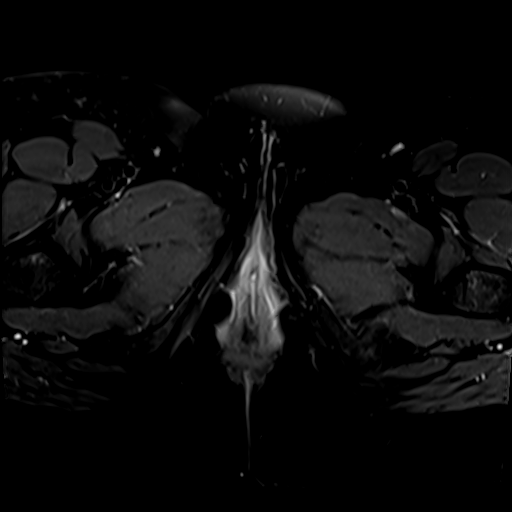
[im 17/34]
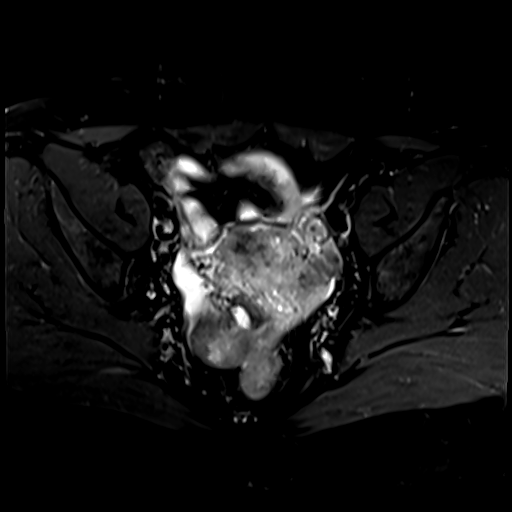
[im 34/34]
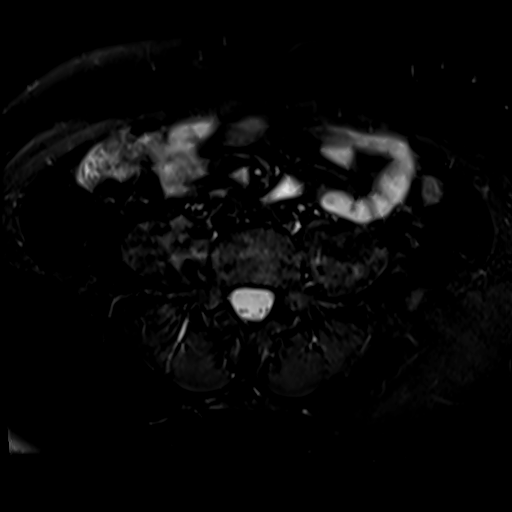

[Series 7: T2 · sagittal · 5.0mm · 0.55mm/px · 3 of 30 slices shown (4 of 4)]
[im 1/30]
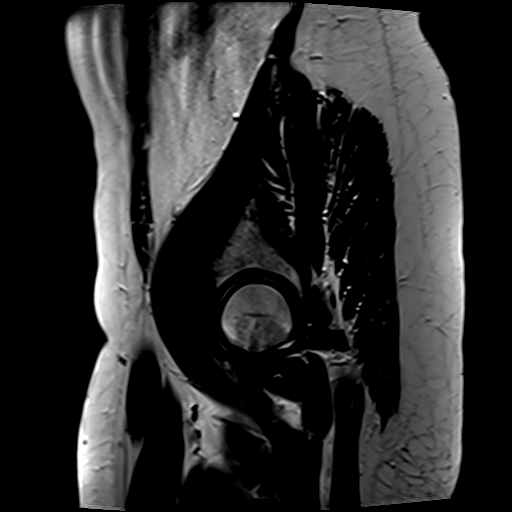
[im 15/30]
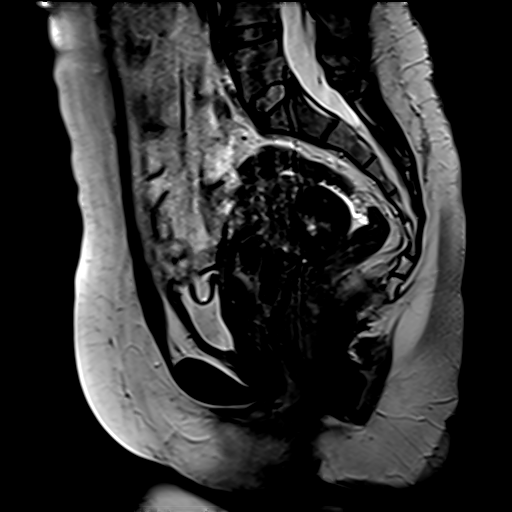
[im 30/30]
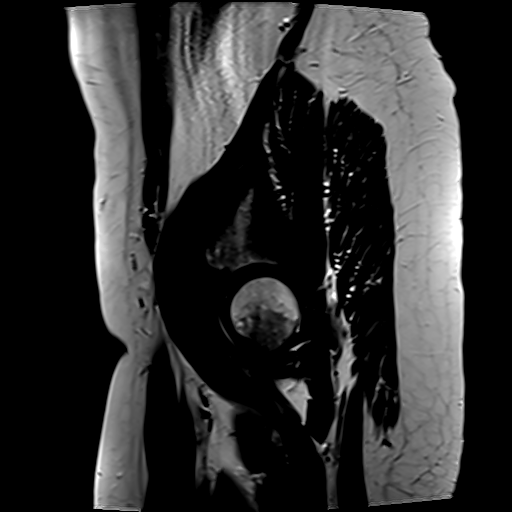

[Series 8: T1 · axial · 4.0mm · 0.84mm/px · z∈[-49,+187]mm · 6 of 60 slices shown (1 of 2)]
[im 1/60]
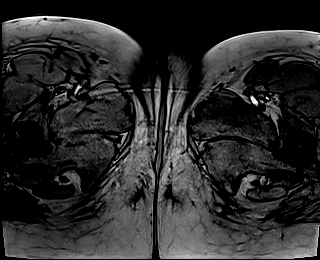
[im 12/60]
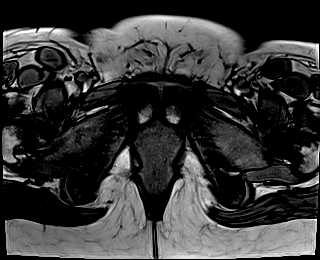
[im 24/60]
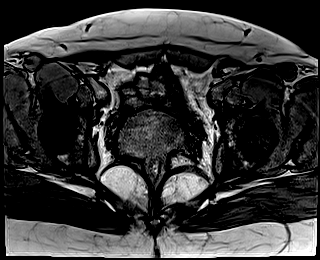
[im 36/60]
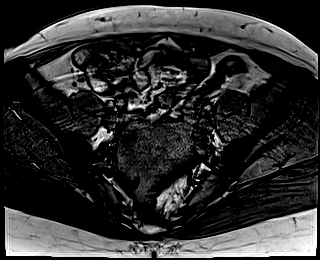
[im 48/60]
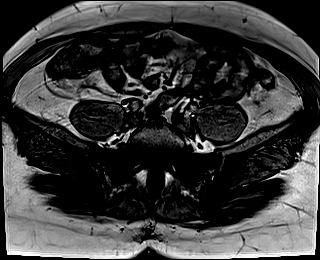
[im 60/60]
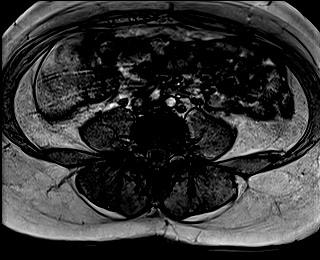

[Series 9: T1 · axial · 4.0mm · 0.84mm/px · z∈[-49,+187]mm · 6 of 60 slices shown (2 of 2)]
[im 1/60]
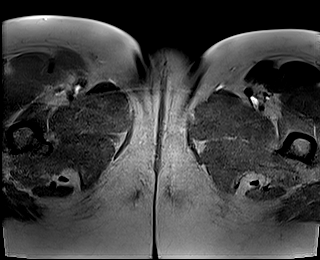
[im 12/60]
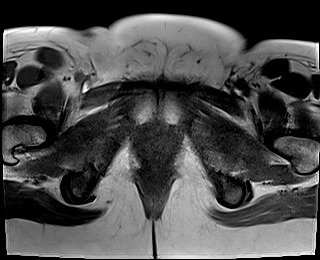
[im 24/60]
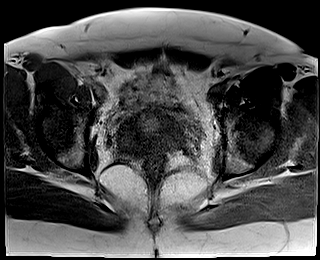
[im 36/60]
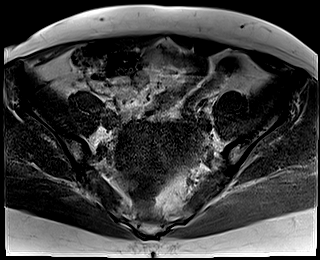
[im 48/60]
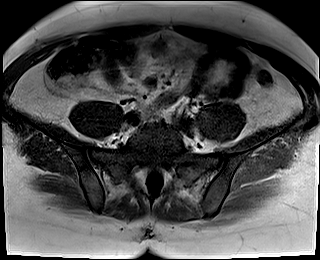
[im 60/60]
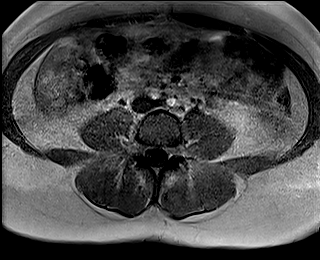

[Series 10: DWI · axial · 5.0mm · 2.80mm/px · z∈[-25,+145]mm · 8 of 102 slices shown (1 of 3)]
[im 1/102]
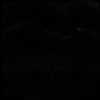
[im 13/102]
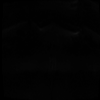
[im 26/102]
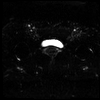
[im 38/102]
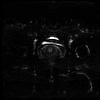
[im 64/102]
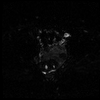
[im 76/102]
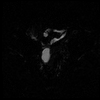
[im 89/102]
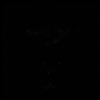
[im 102/102]
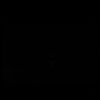

[Series 11: DWI · axial · 5.0mm · 2.80mm/px · z∈[-25,+145]mm · 3 of 35 slices shown (2 of 3)]
[im 1/35]
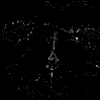
[im 18/35]
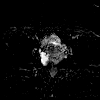
[im 35/35]
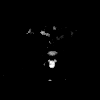

[Series 12: DWI · axial · 5.0mm · 2.80mm/px · z∈[-25,+145]mm · 3 of 35 slices shown (3 of 3)]
[im 1/35]
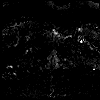
[im 18/35]
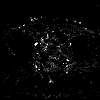
[im 35/35]
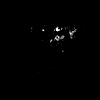

[Series 14: T1 dynamic · axial · 3.0mm · 0.84mm/px · z∈[-25,+11]mm · 2 of 64 slices shown]
[im 1/64]
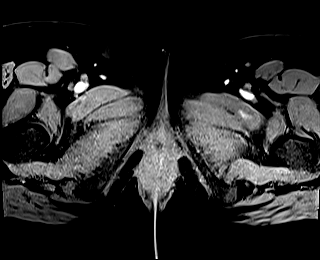
[im 13/64]
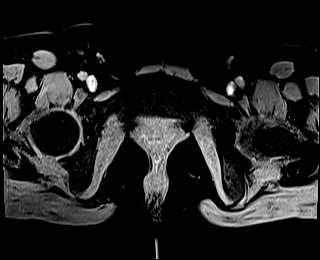

[43 of 48 positions shown; findings below may reference images not displayed]

FINDINGS: Urinary Tract: Urinary bladder is collapsed. No distal ureteral
dilation.

Bowel: No bowel obstruction or acute bowel process visualized on
limited imaging of the pelvis

Vascular/Lymphatic: Not well evaluated given lack of intravenous
contrast. No vascular dilation in the pelvis. No adenopathy.

Reproductive: Adnexal structures are unremarkable. LEFT ovary along
the LEFT lateral uterus near the uterine fundus. RIGHT ovary with
small simple appearing dominant follicle.

Uterus is retroverted with multiple leiomyomata. Dominant leiomyoma
shows ovoid morphology and central increased T2 signal measuring
x 3.6 x 3.5 cm, well-circumscribed margins are noted.

This is along the posterior uterus and is largely subserosal but
with broad contact with the posterior uterus.

LEFT fundal leiomyomata largest at 2.3 x 1.5 x 1.8 cm smaller
subserosal leiomyoma adjacent to the LEFT ovary measures 13 mm.
Smaller leiomyomata scattered about the uterus.

Endometrium grossly unremarkable on limited assessment due to
artifact.

Other:  Trace free fluid in the pelvis.

Musculoskeletal: No suspicious bone lesions identified.
IMPRESSION: Fibroid uterus, as above. Dominant leiomyoma is along the posterior
uterus showing broad contact near the uterine fundus with
significant subserosal component.

Trace free fluid in the pelvis.
